# Patient Record
Sex: Female | Born: 1990
Health system: Southern US, Community
[De-identification: ages and names within clinical notes are randomized; demographics above are authoritative.]

## PROBLEM LIST (undated history)

## (undated) ENCOUNTER — Inpatient Hospital Stay (HOSPITAL_COMMUNITY): Payer: Self-pay

## (undated) DIAGNOSIS — Z87440 Personal history of urinary (tract) infections: Secondary | ICD-10-CM

## (undated) DIAGNOSIS — T7840XA Allergy, unspecified, initial encounter: Secondary | ICD-10-CM

## (undated) DIAGNOSIS — J069 Acute upper respiratory infection, unspecified: Secondary | ICD-10-CM

## (undated) DIAGNOSIS — R519 Headache, unspecified: Secondary | ICD-10-CM

## (undated) DIAGNOSIS — F419 Anxiety disorder, unspecified: Secondary | ICD-10-CM

## (undated) DIAGNOSIS — R51 Headache: Secondary | ICD-10-CM

## (undated) HISTORY — DX: Allergy, unspecified, initial encounter: T78.40XA

## (undated) HISTORY — DX: Personal history of urinary (tract) infections: Z87.440

## (undated) HISTORY — DX: Headache, unspecified: R51.9

## (undated) HISTORY — DX: Acute upper respiratory infection, unspecified: J06.9

## (undated) HISTORY — PX: NO PAST SURGERIES: SHX2092

## (undated) HISTORY — DX: Anxiety disorder, unspecified: F41.9

## (undated) HISTORY — DX: Headache: R51

---

## 2016-06-29 DIAGNOSIS — Z0000 Encounter for general adult medical examination without abnormal findings: Secondary | ICD-10-CM | POA: Diagnosis not present

## 2016-07-02 DIAGNOSIS — Z0000 Encounter for general adult medical examination without abnormal findings: Secondary | ICD-10-CM | POA: Diagnosis not present

## 2016-07-02 DIAGNOSIS — Z6830 Body mass index (BMI) 30.0-30.9, adult: Secondary | ICD-10-CM | POA: Diagnosis not present

## 2016-12-31 DIAGNOSIS — Z3490 Encounter for supervision of normal pregnancy, unspecified, unspecified trimester: Secondary | ICD-10-CM | POA: Diagnosis not present

## 2016-12-31 DIAGNOSIS — Z6830 Body mass index (BMI) 30.0-30.9, adult: Secondary | ICD-10-CM | POA: Diagnosis not present

## 2016-12-31 DIAGNOSIS — N910 Primary amenorrhea: Secondary | ICD-10-CM | POA: Diagnosis not present

## 2016-12-31 DIAGNOSIS — N912 Amenorrhea, unspecified: Secondary | ICD-10-CM | POA: Diagnosis not present

## 2017-01-23 DIAGNOSIS — N911 Secondary amenorrhea: Secondary | ICD-10-CM | POA: Diagnosis not present

## 2017-02-05 DIAGNOSIS — Z3401 Encounter for supervision of normal first pregnancy, first trimester: Secondary | ICD-10-CM | POA: Diagnosis not present

## 2017-02-05 LAB — OB RESULTS CONSOLE ABO/RH: RH TYPE: POSITIVE

## 2017-02-05 LAB — OB RESULTS CONSOLE RUBELLA ANTIBODY, IGM: RUBELLA: IMMUNE

## 2017-02-05 LAB — OB RESULTS CONSOLE HEPATITIS B SURFACE ANTIGEN: HEP B S AG: NEGATIVE

## 2017-02-05 LAB — OB RESULTS CONSOLE GC/CHLAMYDIA
Chlamydia: NEGATIVE
Gonorrhea: NEGATIVE

## 2017-02-05 LAB — OB RESULTS CONSOLE HIV ANTIBODY (ROUTINE TESTING): HIV: NONREACTIVE

## 2017-02-05 LAB — OB RESULTS CONSOLE ANTIBODY SCREEN: ANTIBODY SCREEN: NEGATIVE

## 2017-02-05 LAB — OB RESULTS CONSOLE RPR: RPR: NONREACTIVE

## 2017-02-11 DIAGNOSIS — Z3401 Encounter for supervision of normal first pregnancy, first trimester: Secondary | ICD-10-CM | POA: Diagnosis not present

## 2017-02-11 DIAGNOSIS — Z3480 Encounter for supervision of other normal pregnancy, unspecified trimester: Secondary | ICD-10-CM | POA: Diagnosis not present

## 2017-02-24 DIAGNOSIS — Z3683 Encounter for fetal screening for congenital cardiac abnormalities: Secondary | ICD-10-CM | POA: Diagnosis not present

## 2017-02-24 DIAGNOSIS — Z3491 Encounter for supervision of normal pregnancy, unspecified, first trimester: Secondary | ICD-10-CM | POA: Diagnosis not present

## 2017-02-24 DIAGNOSIS — Z36 Encounter for antenatal screening for chromosomal anomalies: Secondary | ICD-10-CM | POA: Diagnosis not present

## 2017-03-01 DIAGNOSIS — H5213 Myopia, bilateral: Secondary | ICD-10-CM | POA: Diagnosis not present

## 2017-04-09 DIAGNOSIS — Z363 Encounter for antenatal screening for malformations: Secondary | ICD-10-CM | POA: Diagnosis not present

## 2017-04-09 DIAGNOSIS — Z3400 Encounter for supervision of normal first pregnancy, unspecified trimester: Secondary | ICD-10-CM | POA: Diagnosis not present

## 2017-04-09 DIAGNOSIS — Z3A19 19 weeks gestation of pregnancy: Secondary | ICD-10-CM | POA: Diagnosis not present

## 2017-05-08 DIAGNOSIS — Z362 Encounter for other antenatal screening follow-up: Secondary | ICD-10-CM | POA: Diagnosis not present

## 2017-05-08 DIAGNOSIS — Z3400 Encounter for supervision of normal first pregnancy, unspecified trimester: Secondary | ICD-10-CM | POA: Diagnosis not present

## 2017-05-08 DIAGNOSIS — Z3A23 23 weeks gestation of pregnancy: Secondary | ICD-10-CM | POA: Diagnosis not present

## 2017-06-05 DIAGNOSIS — Z3400 Encounter for supervision of normal first pregnancy, unspecified trimester: Secondary | ICD-10-CM | POA: Diagnosis not present

## 2017-06-05 DIAGNOSIS — Z23 Encounter for immunization: Secondary | ICD-10-CM | POA: Diagnosis not present

## 2017-06-05 DIAGNOSIS — Z3480 Encounter for supervision of other normal pregnancy, unspecified trimester: Secondary | ICD-10-CM | POA: Diagnosis not present

## 2017-06-10 NOTE — L&D Delivery Note (Signed)
Operative Delivery Note At 7:39 PM a viable female was delivered via Vaginal, Vacuum Neurosurgeon).  Presentation: vertex; Position: Left,, Occiput,, Anterior; Station: +3. Of +3 She progressed well through IOL to c/c/+3 with excellent pushing effort. However, recurr, deep, decels noted with every ctxn and no accs/response to scalp stim. Discussed expedited delivery and patient consented to a VAVD. Pulled easily over one contraction.  Verbal consent: obtained from patient.  Risks and benefits discussed in detail.  Risks include, but are not limited to the risks of anesthesia, bleeding, infection, damage to maternal tissues, fetal cephalhematoma.  There is also the risk of inability to effect vaginal delivery of the head, or shoulder dystocia that cannot be resolved by established maneuvers, leading to the need for emergency cesarean section.  APGAR: pending, ; weight pending .   Placenta status: L&D Cord:  with the following complications: tight nuchal x1.  Cord pH: sent  Anesthesia:  CLE Instruments: Kiwi vacuum Episiotomy: None Lacerations:  2nd degree Suture Repair: 3.0 vicryl Est. Blood Loss (mL):  300cc No occult fourth on rectal exam  It's a girl - "[employer]cho[employer]"!! Mom to postpartum.  Baby to Couplet care / Skin to Skin.  Tyson Dense 09/04/2017, 8:06 PM

## 2017-06-17 DIAGNOSIS — O99810 Abnormal glucose complicating pregnancy: Secondary | ICD-10-CM | POA: Diagnosis not present

## 2017-07-15 ENCOUNTER — Other Ambulatory Visit: Payer: Self-pay | Admitting: *Deleted

## 2017-07-15 NOTE — Patient Outreach (Signed)
Received referral on 07/15/17 to contact this Chelsey Roberts regarding her positive answer on the Babyscripts survey related to family history of diabetes. Edwin Dada on her mobile number; she states her grandparent had diabetes but she does not have preexisting diabetes or gestational diabetes. She voices no complaints and states she is looking forward to the birth of her baby in early March. She did have a question about entering the code for completing the breastfeeding component of Babyscripts so provided her with answer after consulting with care management assistant [facility].  No needs identified so will close case to [facility] CM services.  Barrington Ellison RN,CCM,CDE [place] Management Coordinator Office Phone [phone] Office Fax [phone]

## 2017-08-05 DIAGNOSIS — Z3685 Encounter for antenatal screening for Streptococcus B: Secondary | ICD-10-CM | POA: Diagnosis not present

## 2017-08-21 ENCOUNTER — Telehealth (HOSPITAL_COMMUNITY): Payer: Self-pay | Admitting: *Deleted

## 2017-08-21 ENCOUNTER — Encounter (HOSPITAL_COMMUNITY): Payer: Self-pay | Admitting: *Deleted

## 2017-08-21 NOTE — Telephone Encounter (Signed)
Preadmission screen  

## 2017-09-03 NOTE — H&P (Signed)
Chelsey Roberts is a 26 y.o. female presenting for IOL. OB History    Gravida  1   Para      Term      Preterm      AB      Living        SAB      TAB      Ectopic      Multiple      Live Births             Past Medical History:  Diagnosis Date  . Headache   . History of acute cystitis    Family History: family history includes Diabetes in her paternal grandfather; Esophageal cancer in her maternal grandfather; Heart attack in her paternal grandfather; Hypertension in her paternal grandfather and paternal grandmother; Stroke in her paternal grandmother. Social History:  reports that she has never smoked. She has never used smokeless tobacco. She reports that she does not drink alcohol or use drugs.     Maternal Diabetes: No Genetic Screening: Normal Maternal Ultrasounds/Referrals: Normal Fetal Ultrasounds or other Referrals:  None Maternal Substance Abuse:  No Significant Maternal Medications:  None Significant Maternal Lab Results:  None Other Comments:  None  ROS History   Last menstrual period 11/26/2016. Exam Physical Exam  Prenatal labs: ABO, Rh: A/Positive/-- (08/29 0000) Antibody: Negative (08/29 0000) Rubella: Immune (08/29 0000) RPR: Nonreactive (08/29 0000)  HBsAg: Negative (08/29 0000)  HIV: Non-reactive (08/29 0000)  GBS:     Assessment/Plan: 27yo G1P0 @ 40.2 wga presenting for IOL. Cervix favorable - 3-4/80/-2. Plan pitocin/AROM. GBS neg. It's a girl - "Chelsey Roberts".     Tyson Dense 09/03/2017, 10:34 AM

## 2017-09-04 ENCOUNTER — Inpatient Hospital Stay (HOSPITAL_COMMUNITY)
Admission: RE | Admit: 2017-09-04 | Discharge: 2017-09-06 | DRG: 807 | Disposition: A | Discharge status: Home / Self Care | Payer: 59 | Source: Ambulatory Visit | Attending: Obstetrics and Gynecology | Admitting: Obstetrics and Gynecology

## 2017-09-04 ENCOUNTER — Inpatient Hospital Stay (HOSPITAL_COMMUNITY): Payer: 59 | Admitting: Anesthesiology

## 2017-09-04 ENCOUNTER — Encounter (HOSPITAL_COMMUNITY): Payer: Self-pay

## 2017-09-04 ENCOUNTER — Inpatient Hospital Stay (HOSPITAL_COMMUNITY): Admission: RE | Admit: 2017-09-04 | Payer: 59 | Source: Ambulatory Visit

## 2017-09-04 DIAGNOSIS — Z3A40 40 weeks gestation of pregnancy: Secondary | ICD-10-CM | POA: Diagnosis not present

## 2017-09-04 DIAGNOSIS — Z3483 Encounter for supervision of other normal pregnancy, third trimester: Secondary | ICD-10-CM | POA: Diagnosis present

## 2017-09-04 DIAGNOSIS — Z3490 Encounter for supervision of normal pregnancy, unspecified, unspecified trimester: Secondary | ICD-10-CM

## 2017-09-04 LAB — CBC
HCT: 41 % (ref 36.0–46.0)
Hemoglobin: 14 g/dL (ref 12.0–15.0)
MCH: 30.6 pg (ref 26.0–34.0)
MCHC: 34.1 g/dL (ref 30.0–36.0)
MCV: 89.7 fL (ref 78.0–100.0)
PLATELETS: 207 10*3/uL (ref 150–400)
RBC: 4.57 MIL/uL (ref 3.87–5.11)
RDW: 13 % (ref 11.5–15.5)
WBC: 15.3 10*3/uL — AB (ref 4.0–10.5)

## 2017-09-04 LAB — ABO/RH: ABO/RH(D): A POS

## 2017-09-04 LAB — TYPE AND SCREEN
ABO/RH(D): A POS
Antibody Screen: NEGATIVE

## 2017-09-04 MED ORDER — OXYCODONE-ACETAMINOPHEN 5-325 MG PO TABS: 1.0000 | ORAL_TABLET | ORAL | Status: DC | PRN

## 2017-09-04 MED ORDER — PHENYLEPHRINE 40 MCG/ML (10ML) SYRINGE FOR IV PUSH (FOR BLOOD PRESSURE SUPPORT)
80.0000 ug | PREFILLED_SYRINGE | INTRAVENOUS | Status: DC | PRN
  Filled 2017-09-04: qty 10

## 2017-09-04 MED ORDER — OXYTOCIN 40 UNITS IN LACTATED RINGERS INFUSION - SIMPLE MED: 2.5000 [IU]/h | INTRAVENOUS | Status: DC

## 2017-09-04 MED ORDER — BENZOCAINE-MENTHOL 20-0.5 % EX AERO
1.0000 "application " | INHALATION_SPRAY | CUTANEOUS | Status: DC | PRN
  Administered 2017-09-05: 1 via TOPICAL
  Filled 2017-09-04: qty 56

## 2017-09-04 MED ORDER — DIBUCAINE 1 % RE OINT: 1.0000 "application " | TOPICAL_OINTMENT | RECTAL | Status: DC | PRN

## 2017-09-04 MED ORDER — ONDANSETRON HCL 4 MG/2ML IJ SOLN: 4.0000 mg | INTRAMUSCULAR | Status: DC | PRN

## 2017-09-04 MED ORDER — ACETAMINOPHEN 325 MG PO TABS
650.0000 mg | ORAL_TABLET | ORAL | Status: DC | PRN
  Administered 2017-09-05 – 2017-09-06 (×4): 650 mg via ORAL
  Filled 2017-09-04 (×4): qty 2

## 2017-09-04 MED ORDER — EPHEDRINE 5 MG/ML INJ: 10.0000 mg | INTRAVENOUS | Status: DC | PRN

## 2017-09-04 MED ORDER — LACTATED RINGERS IV SOLN: 500.0000 mL | Freq: Once | INTRAVENOUS | Status: DC

## 2017-09-04 MED ORDER — OXYCODONE HCL 5 MG PO TABS: 10.0000 mg | ORAL_TABLET | ORAL | Status: DC | PRN

## 2017-09-04 MED ORDER — OXYTOCIN 40 UNITS IN LACTATED RINGERS INFUSION - SIMPLE MED
1.0000 m[IU]/min | INTRAVENOUS | Status: DC
  Administered 2017-09-04: 2 m[IU]/min via INTRAVENOUS
  Filled 2017-09-04: qty 1000

## 2017-09-04 MED ORDER — FLEET ENEMA 7-19 GM/118ML RE ENEM: 1.0000 | ENEMA | RECTAL | Status: DC | PRN

## 2017-09-04 MED ORDER — LACTATED RINGERS IV SOLN
INTRAVENOUS | Status: DC
  Administered 2017-09-04 (×2): via INTRAVENOUS

## 2017-09-04 MED ORDER — ONDANSETRON HCL 4 MG PO TABS: 4.0000 mg | ORAL_TABLET | ORAL | Status: DC | PRN

## 2017-09-04 MED ORDER — PHENYLEPHRINE 40 MCG/ML (10ML) SYRINGE FOR IV PUSH (FOR BLOOD PRESSURE SUPPORT): 80.0000 ug | PREFILLED_SYRINGE | INTRAVENOUS | Status: DC | PRN

## 2017-09-04 MED ORDER — DIPHENHYDRAMINE HCL 25 MG PO CAPS: 25.0000 mg | ORAL_CAPSULE | Freq: Four times a day (QID) | ORAL | Status: DC | PRN

## 2017-09-04 MED ORDER — TETANUS-DIPHTH-ACELL PERTUSSIS 5-2.5-18.5 LF-MCG/0.5 IM SUSP: 0.5000 mL | Freq: Once | INTRAMUSCULAR | Status: DC

## 2017-09-04 MED ORDER — OXYCODONE HCL 5 MG PO TABS: 5.0000 mg | ORAL_TABLET | ORAL | Status: DC | PRN

## 2017-09-04 MED ORDER — LIDOCAINE HCL (PF) 1 % IJ SOLN
INTRAMUSCULAR | Status: DC | PRN
  Administered 2017-09-04: 5 mL
  Administered 2017-09-04: 2 mL
  Administered 2017-09-04: 3 mL

## 2017-09-04 MED ORDER — SIMETHICONE 80 MG PO CHEW: 80.0000 mg | CHEWABLE_TABLET | ORAL | Status: DC | PRN

## 2017-09-04 MED ORDER — ZOLPIDEM TARTRATE 5 MG PO TABS: 5.0000 mg | ORAL_TABLET | Freq: Every evening | ORAL | Status: DC | PRN

## 2017-09-04 MED ORDER — WITCH HAZEL-GLYCERIN EX PADS: 1.0000 "application " | MEDICATED_PAD | CUTANEOUS | Status: DC | PRN

## 2017-09-04 MED ORDER — ACETAMINOPHEN 325 MG PO TABS: 650.0000 mg | ORAL_TABLET | ORAL | Status: DC | PRN

## 2017-09-04 MED ORDER — PRENATAL MULTIVITAMIN CH
1.0000 | ORAL_TABLET | Freq: Every day | ORAL | Status: DC
  Administered 2017-09-05 – 2017-09-06 (×2): 1 via ORAL
  Filled 2017-09-04 (×2): qty 1

## 2017-09-04 MED ORDER — LIDOCAINE HCL (PF) 1 % IJ SOLN
30.0000 mL | INTRAMUSCULAR | Status: DC | PRN
  Filled 2017-09-04: qty 30

## 2017-09-04 MED ORDER — SOD CITRATE-CITRIC ACID 500-334 MG/5ML PO SOLN: 30.0000 mL | ORAL | Status: DC | PRN

## 2017-09-04 MED ORDER — SENNOSIDES-DOCUSATE SODIUM 8.6-50 MG PO TABS
2.0000 | ORAL_TABLET | ORAL | Status: DC
  Administered 2017-09-05 (×2): 2 via ORAL
  Filled 2017-09-04 (×2): qty 2

## 2017-09-04 MED ORDER — OXYTOCIN BOLUS FROM INFUSION
500.0000 mL | Freq: Once | INTRAVENOUS | Status: AC
  Administered 2017-09-04: 500 mL via INTRAVENOUS

## 2017-09-04 MED ORDER — DIPHENHYDRAMINE HCL 50 MG/ML IJ SOLN: 12.5000 mg | INTRAMUSCULAR | Status: DC | PRN

## 2017-09-04 MED ORDER — LACTATED RINGERS IV SOLN
500.0000 mL | Freq: Once | INTRAVENOUS | Status: AC
  Administered 2017-09-04: 500 mL via INTRAVENOUS

## 2017-09-04 MED ORDER — TERBUTALINE SULFATE 1 MG/ML IJ SOLN
0.2500 mg | Freq: Once | INTRAMUSCULAR | Status: AC | PRN
  Administered 2017-09-04: 0.25 mg via SUBCUTANEOUS
  Filled 2017-09-04: qty 1

## 2017-09-04 MED ORDER — ONDANSETRON HCL 4 MG/2ML IJ SOLN: 4.0000 mg | Freq: Four times a day (QID) | INTRAMUSCULAR | Status: DC | PRN

## 2017-09-04 MED ORDER — OXYCODONE-ACETAMINOPHEN 5-325 MG PO TABS: 2.0000 | ORAL_TABLET | ORAL | Status: DC | PRN

## 2017-09-04 MED ORDER — LACTATED RINGERS IV SOLN: 500.0000 mL | INTRAVENOUS | Status: DC | PRN

## 2017-09-04 MED ORDER — COCONUT OIL OIL: 1.0000 "application " | TOPICAL_OIL | Status: DC | PRN

## 2017-09-04 MED ORDER — FENTANYL 2.5 MCG/ML BUPIVACAINE 1/10 % EPIDURAL INFUSION (WH - ANES)
14.0000 mL/h | INTRAMUSCULAR | Status: DC | PRN
  Administered 2017-09-04 (×2): 14 mL/h via EPIDURAL
  Filled 2017-09-04 (×2): qty 100

## 2017-09-04 MED ORDER — IBUPROFEN 600 MG PO TABS
600.0000 mg | ORAL_TABLET | Freq: Four times a day (QID) | ORAL | Status: DC
  Administered 2017-09-05 – 2017-09-06 (×7): 600 mg via ORAL
  Filled 2017-09-04 (×7): qty 1

## 2017-09-04 NOTE — Anesthesia Procedure Notes (Signed)
Epidural Patient location during procedure: OB Start time: 09/04/2017 9:15 AM End time: 09/04/2017 9:35 AM  Staffing Anesthesiologist: Suzette Battiest, MD Performed: anesthesiologist   Preanesthetic Checklist Completed: patient identified, site marked, surgical consent, pre-op evaluation, timeout performed, IV checked, risks and benefits discussed and monitors and equipment checked  Epidural Patient position: sitting Prep: site prepped and draped and DuraPrep Patient monitoring: continuous pulse ox and blood pressure Approach: right paramedian Location: L4-L5 Injection technique: LOR air  Needle:  Needle type: Tuohy  Needle gauge: 17 G Needle length: 9 cm and 9 Needle insertion depth: 7 cm Catheter type: closed end flexible Catheter size: 19 Gauge Catheter at skin depth: 14 (12-->14cm when laid in lat decub position) cm Test dose: negative  Assessment Events: blood not aspirated, injection not painful, no injection resistance, negative IV test and no paresthesia  Additional Notes Challenging epidural. Multiple interspaces attempted via midline approach without success. Right paramedian approach successful as above.

## 2017-09-04 NOTE — Anesthesia Preprocedure Evaluation (Signed)
Anesthesia Evaluation  Patient identified by MRN, date of birth, ID band Patient awake    Reviewed: Allergy & Precautions, Patient's Chart, lab work & pertinent test results  Airway Mallampati: II  TM Distance: >3 FB     Dental   Pulmonary neg pulmonary ROS,    Pulmonary exam normal        Cardiovascular negative cardio ROS Normal cardiovascular exam     Neuro/Psych negative neurological ROS     GI/Hepatic negative GI ROS, Neg liver ROS,   Endo/Other  negative endocrine ROS  Renal/GU negative Renal ROS     Musculoskeletal   Abdominal   Peds  Hematology negative hematology ROS (+)   Anesthesia Other Findings   Reproductive/Obstetrics (+) Pregnancy                             Lab Results  Component Value Date   WBC 15.3 (H) 09/04/2017   HGB 14.0 09/04/2017   HCT 41.0 09/04/2017   MCV 89.7 09/04/2017   PLT 207 09/04/2017    Anesthesia Physical Anesthesia Plan  ASA: II  Anesthesia Plan: Epidural   Post-op Pain Management:    Induction:   PONV Risk Score and Plan: Treatment may vary due to age or medical condition  Airway Management Planned: Natural Airway  Additional Equipment:   Intra-op Plan:   Post-operative Plan:   Informed Consent: I have reviewed the patients History and Physical, chart, labs and discussed the procedure including the risks, benefits and alternatives for the proposed anesthesia with the patient or authorized representative who has indicated his/her understanding and acceptance.     Plan Discussed with:   Anesthesia Plan Comments:         Anesthesia Quick Evaluation

## 2017-09-04 NOTE — Progress Notes (Signed)
CTBS for recurr decels. Terbutaline given. Pt repositioned. IVF bolus. Pitocin turned off. FHT improved. FHT now cat 1. SVE 6.5-7/100/0. IUPC placed. CTM closely. Pitocin restart when able.    Arty Baumgartner MD

## 2017-09-04 NOTE — Progress Notes (Signed)
C/o ctxns overnight before arrival SVE 3.5-4/80/-2, AROM for clear fluid Plan for pitocin GBS neg

## 2017-09-04 NOTE — Anesthesia Pain Management Evaluation Note (Signed)
  CRNA Pain Management Visit Note  Patient: Chelsey Roberts, 27 y.o., female  "Hello I am a member of the anesthesia team at [facility]. We have an anesthesia team available at all times to provide care throughout the hospital, including epidural management and anesthesia for C-section. I don't know your plan for the delivery whether it a natural birth, water birth, IV sedation, nitrous supplementation, doula or epidural, but we want to meet your pain goals."   1.Was your pain managed to your expectations on prior hospitalizations?   No prior hospitalizations  2.What is your expectation for pain management during this hospitalization?     Epidural  3.How can we help you reach that goal? epidural  Record the patient's initial score and the patient's pain goal.   Pain: 9  Pain Goal: 9 [facility] wants you to be able to say your pain was always managed very well.  [name] 09/04/2017

## 2017-09-05 ENCOUNTER — Other Ambulatory Visit: Payer: Self-pay

## 2017-09-05 LAB — CBC
HEMATOCRIT: 32.2 % — AB (ref 36.0–46.0)
HEMOGLOBIN: 11.1 g/dL — AB (ref 12.0–15.0)
MCH: 31 pg (ref 26.0–34.0)
MCHC: 34.5 g/dL (ref 30.0–36.0)
MCV: 89.9 fL (ref 78.0–100.0)
Platelets: 174 10*3/uL (ref 150–400)
RBC: 3.58 MIL/uL — AB (ref 3.87–5.11)
RDW: 13.2 % (ref 11.5–15.5)
WBC: 20.3 10*3/uL — ABNORMAL HIGH (ref 4.0–10.5)

## 2017-09-05 LAB — RPR: RPR: NONREACTIVE

## 2017-09-05 NOTE — Anesthesia Postprocedure Evaluation (Signed)
Anesthesia Post Note  Patient: Chelsey Roberts  Procedure(s) Performed: AN AD HOC LABOR EPIDURAL     Patient location during evaluation: Mother Baby Anesthesia Type: Epidural Level of consciousness: awake and alert Pain management: pain level controlled Vital Signs Assessment: post-procedure vital signs reviewed and stable Respiratory status: spontaneous breathing Cardiovascular status: blood pressure returned to baseline Postop Assessment: no headache, patient able to bend at knees, no backache, no apparent nausea or vomiting, epidural receding and adequate PO intake Anesthetic complications: no    Last Vitals:  Vitals:   09/05/17 0015 09/05/17 0530  BP: 138/70 127/65  Pulse: 85 (!) 56  Resp: 18 18  Temp:  36.9 C    Last Pain:  Vitals:   09/05/17 0530  TempSrc: Oral  PainSc: 4    Pain Goal:                 [name]

## 2017-09-05 NOTE — Progress Notes (Signed)
Post Partum Day 1 Subjective: no complaints  Objective: Blood pressure 127/65, pulse (!) 56, temperature 98.5 F (36.9 C), temperature source Oral, resp. rate 18, height 5\' 2"  (1.575 m), weight 175 lb 6.4 oz (79.6 kg), last menstrual period 11/26/2016, unknown if currently breastfeeding.  Physical Exam:  General: alert and cooperative Lochia: appropriate Uterine Fundus: firm Incision: healing well, no significant drainage DVT Evaluation: No evidence of DVT seen on physical exam.  Recent Labs    09/04/17 0746 09/05/17 0523  HGB 14.0 11.1*  HCT 41.0 32.2*    Assessment/Plan: Plan for discharge tomorrow and Breastfeeding   LOS: 1 day   Marylynn Pearson 09/05/2017, 8:21 AM

## 2017-09-05 NOTE — Lactation Note (Signed)
This note was copied from a baby's chart. Lactation Consultation Note  Patient Name: Chelsey Roberts [id number] Date: 09/05/2017 Reason for consult: Initial assessment;Primapara;1st time breastfeeding;Term  Visited with P42 Mom of term infant at 63 hrs old.  Baby has latched and breastfed 7 times already.  Mom concerned that baby has difficulty latching to right breast.  Offered to assist with positioning and latching.   Baby spitting moderate of mucous, with small streak of blood noted. Baby burping and gagging a little.  Baby not rooting to eat, positioned baby at the breast in football hold.  Demonstrated hand expression and breast massage, colostrum expressed.  Reassured Mom that baby needed to sleep on her chest STS.  Encouraged Mom to call at next feeding.  Mom has a area at 7pm around her nipple-areola on right breast.  This area resembles a flat nipple.  Mom has never been told it was an extra nipple, but she did have changes during later pregnancy, along with color changes of her areola.  Lactation brochure left with Mom.  Mom aware of IP and OP lactation services.  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  Tote Medela PIS DEBP given to Mom as part of her [employer].    Interventions: Breast feeding basics reviewed;Skin to skin;Hand express;Breast massage  Lactation Tools Discussed/Used WIC Program: No   Consult Status Consult Status: Follow-up Date: 09/06/17 Follow-up type: In-patient    [name] 09/05/2017, 3:09 PM

## 2017-09-06 LAB — BIRTH TISSUE RECOVERY COLLECTION (PLACENTA DONATION)

## 2017-09-06 MED ORDER — IBUPROFEN 600 MG PO TABS: 600.0000 mg | ORAL_TABLET | Freq: Four times a day (QID) | ORAL | 0 refills | Status: DC

## 2017-09-06 NOTE — Discharge Summary (Signed)
Obstetric Discharge Summary Reason for Admission: induction of labor Prenatal Procedures: none Intrapartum Procedures: spontaneous vaginal delivery Postpartum Procedures: none Complications-Operative and Postpartum: none Hemoglobin  Date Value Ref Range Status  09/05/2017 11.1 (L) 12.0 - 15.0 g/dL Final    Comment:    DELTA CHECK NOTED REPEATED TO VERIFY    HCT  Date Value Ref Range Status  09/05/2017 32.2 (L) 36.0 - 46.0 % Final    Physical Exam:  General: alert and cooperative Lochia: appropriate Uterine Fundus: firm Incision: no significant drainage DVT Evaluation: No evidence of DVT seen on physical exam.  Discharge Diagnoses: Term Pregnancy-delivered  Discharge Information: Date: 09/06/2017 Activity: pelvic rest Diet: routine Medications: PNV and Ibuprofen Condition: stable Instructions: refer to practice specific booklet Discharge to: home Follow-up [place] Schedule an appointment as soon as possible for a visit in 6 week(s).   Contact information: [place] [phone]           Newborn Data: Live born female  Birth Weight: 7 lb 3 oz (3260 g) APGAR: 80, 74  Newborn Delivery   Birth date/time:  09/04/2017 19:39:00 Delivery type:  Vaginal, Vacuum (Extractor)     Home with mother.  Marylynn Pearson 09/06/2017, 9:47 AM

## 2017-09-06 NOTE — Lactation Note (Addendum)
This note was copied from a baby's chart. Lactation Consultation Note  Patient Name: Girl Jiah Bari [id number] Date: 09/06/2017 Reason for consult: Follow-up assessment   P1, Baby 30 hours old and mother states breastfeeding is going well. Mother's abrasion on R nipple with bruise is healing.  She has coconut oil. Provided mother w/ manual pump. Baby cluster fed last night and mother states latch has improved. Discussed milk storage and pumping when going back to work. Mom encouraged to feed baby 8-12 times/24 hours and with feeding cues.  Reviewed engorgement care and monitoring voids/stools.    Maternal Data    Feeding    LATCH Score                   Interventions    Lactation Tools Discussed/Used     Consult Status Consult Status: Complete    Carlye Grippe 09/06/2017, 9:24 AM

## 2017-09-09 ENCOUNTER — Telehealth (HOSPITAL_COMMUNITY): Payer: Self-pay | Admitting: Lactation Services

## 2017-09-09 NOTE — Telephone Encounter (Signed)
P36, Baby 63 days old.  Mother called because her baby was born at 7 lb 3 ounces and weight had dropped at Peds visit to 6 lb 8 oz.  Mother had been breastfeeding for 30-40 min and MD told mother to limit feedings to 15 min per side and then offer formula afterwards.  Mother is now calling to state she is engorged and has a hard area in her R axillary area. Provided education regarding average breastfeeding time of 30-45 min. Mother did recently pump and received 4 ounces.  Explained to mother that she has a good milk supply and can supplement her baby with her own breastmilk.  Discussed apply warm compress to area for a few minutes before breastfeeding and pumping and massage area to release.  Unsure if it is plugged milk duct.  Mother does have an extra nipple below R areola that I am not sure mother is aware of. LC viewed inpatient.  Suggest mother come in for OP appointment to observe feeding to make sure baby is transferring.  Suggest mother supplement with her own breastmilk by pumping 4-5 times per day and giving volume back to baby after feedings.  MD recommended 1-1.5 ounces after feeding and with the volume pumped mother can easily supplement using her breastmilk. Encouraged mother to breastfeed longer than 15 min if baby desires and observed swallows and then switch to other breast.

## 2017-10-15 DIAGNOSIS — Z1389 Encounter for screening for other disorder: Secondary | ICD-10-CM | POA: Diagnosis not present

## 2018-03-22 ENCOUNTER — Encounter: Payer: Self-pay | Admitting: Nurse Practitioner

## 2018-03-22 ENCOUNTER — Ambulatory Visit (INDEPENDENT_AMBULATORY_CARE_PROVIDER_SITE_OTHER): Payer: Self-pay | Admitting: Nurse Practitioner

## 2018-03-22 VITALS — BP 122/90 | HR 92 | Temp 97.8°F | Resp 18 | Ht 62.0 in | Wt 156.8 lb

## 2018-03-22 DIAGNOSIS — J0140 Acute pansinusitis, unspecified: Secondary | ICD-10-CM

## 2018-03-22 MED ORDER — FLUTICASONE PROPIONATE 50 MCG/ACT NA SUSP: 2.0000 | Freq: Every day | NASAL | 0 refills | Status: DC

## 2018-03-22 MED ORDER — AMOXICILLIN-POT CLAVULANATE 875-125 MG PO TABS: 1.0000 | ORAL_TABLET | Freq: Two times a day (BID) | ORAL | 0 refills | Status: AC

## 2018-03-22 NOTE — Progress Notes (Signed)
Subjective:  Chelsey Roberts is a 27 y.o. female who presents for evaluation of possible sinusitis.  Symptoms include bilateral ear pressure/pain, fever: fevers up to 100.9 degrees, headache described as dull and aching, nasal congestion, post nasal drip, productive cough with yellowish-green colored sputum, sinus pressure, sinus pain and sore throat, tooth pain and facial pain.  Onset of symptoms was 3 days ago, and has been rapidly worsening since that time.  Patient informed that she has a 54-month-old at home, but is not breast-feeding.  Treatment to date:  Dayquil, Nyquil, Excedrin Migraine and netti pot.  High risk factors for influenza complications:  none.  The following portions of the patient's history were reviewed and updated as appropriate:  allergies, current medications and past medical history.  Constitutional: positive for anorexia, fatigue and fevers, negative for chills, night sweats, sweats and weight loss Eyes: negative Ears, nose, mouth, throat, and face: positive for hoarseness, nasal congestion, sore throat and See HPI, negative for ear drainage and earaches Respiratory: positive for cough and sputum, negative for asthma, chronic bronchitis, dyspnea on exertion, pneumonia, stridor and wheezing Cardiovascular: negative Gastrointestinal: positive for decreased appetite, negative for abdominal pain, diarrhea, nausea and vomiting Neurological: positive for headaches, negative for coordination problems, dizziness, paresthesia, speech problems, tremors, vertigo and weakness Allergic/Immunologic: positive for hay fever   Objective:  BP 122/90 (BP Location: Right Arm, Patient Position: Sitting, Cuff Size: Normal)   Pulse 92   Temp 97.8 F (36.6 C) (Oral)   Resp 18   Ht 5\' 2"  (1.575 m)   Wt 156 lb 12.8 oz (71.1 kg)   Breastfeeding? No   BMI 28.68 kg/m  General appearance: alert, cooperative, fatigued and no distress Head: Normocephalic, without obvious abnormality,  atraumatic Eyes: conjunctivae/corneas clear. PERRL, EOM's intact. Fundi benign. Ears: abnormal external canal right ear - mild erythema in canal, TM is opaque, able to visualize landmarks, no bulging, mucoid middle ear fluid and abnormal external canal left ear - mild erythema in canal, TM is opaque, able to visualize landmarks, no bulging, mucoid middle ear fluid Nose: clear discharge, turbinates swollen, inflamed, moderate maxillary sinus tenderness bilateral, moderate frontal sinus tenderness bilateral Throat: lips, mucosa, and tongue normal; teeth and gums normal Lungs: clear to auscultation bilaterally Heart: regular rate and rhythm, S1, S2 normal, no murmur, click, rub or gallop Abdomen: soft, non-tender; bowel sounds normal; no masses,  no organomegaly Pulses: 2+ and symmetric Skin: Skin color, texture, turgor normal. No rashes or lesions Lymph nodes: cervical and submandibular nodes normal Neurologic: Grossly normal    Assessment:  Acute Pansinusitis    Plan:   Exam findings, diagnosis etiology and medication use and indications reviewed with patient. Follow- Up and discharge instructions provided. No emergent/urgent issues found on exam. Patient education was provided. Patient verbalized understanding of information provided and agrees with plan of care (POC), all questions answered. The patient is advised to call or return to clinic if condition does not see an improvement in symptoms, or to seek the care of the closest emergency department if condition worsens with the above plan.   1. Acute pansinusitis, recurrence not specified  - amoxicillin-clavulanate (AUGMENTIN) 875-125 MG tablet; Take 1 tablet by mouth 2 (two) times daily for 10 days.  Dispense: 20 tablet; Refill: 0 - fluticasone (FLONASE) 50 MCG/ACT nasal spray; Place 2 sprays into both nostrils daily for 10 days.  Dispense: 16 g; Refill: 0 -Take medications as prescribed. -Ibuprofen or Tylenol for pain, fever, or  general discomfort. -Warm salt  water gargles for throat discomfort.  May  also use cough drops, or a teaspoon of honey as needed for throat discomfort. -Use humidifier or vaporizer when at home to help with congestion and cough. -Use saline nasal spray throughout the day to help with nasal congestion. -Follow-up if symptoms do not improve within the next 48 to 72 hours.

## 2018-03-22 NOTE — Patient Instructions (Signed)
Sinusitis, Adult -Take medications as prescribed. -Ibuprofen or Tylenol for pain, fever, or general discomfort. -Warm salt water gargles for throat discomfort.  May  also use cough drops, or a teaspoon of honey as needed for throat discomfort. -Use humidifier or vaporizer when at home to help with congestion and cough. -Use saline nasal spray throughout the day to help with nasal congestion. -Follow-up if symptoms do not improve within the next 48 to 72 hours.   Sinusitis is soreness and inflammation of your sinuses. Sinuses are hollow spaces in the bones around your face. Your sinuses are located:  Around your eyes.  In the middle of your forehead.  Behind your nose.  In your cheekbones.  Your sinuses and nasal passages are lined with a stringy fluid (mucus). Mucus normally drains out of your sinuses. When your nasal tissues become inflamed or swollen, the mucus can become trapped or blocked so air cannot flow through your sinuses. This allows bacteria, viruses, and funguses to grow, which leads to infection. Sinusitis can develop quickly and last for 7?10 days (acute) or for more than 12 weeks (chronic). Sinusitis often develops after a cold. What are the causes? This condition is caused by anything that creates swelling in the sinuses or stops mucus from draining, including:  Allergies.  Asthma.  Bacterial or viral infection.  Abnormally shaped bones between the nasal passages.  Nasal growths that contain mucus (nasal polyps).  Narrow sinus openings.  Pollutants, such as chemicals or irritants in the air.  A foreign object stuck in the nose.  A fungal infection. This is rare.  What increases the risk? The following factors may make you more likely to develop this condition:  Having allergies or asthma.  Having had a recent cold or respiratory tract infection.  Having structural deformities or blockages in your nose or sinuses.  Having a weak immune  system.  Doing a lot of swimming or diving.  Overusing nasal sprays.  Smoking.  What are the signs or symptoms? The main symptoms of this condition are pain and a feeling of pressure around the affected sinuses. Other symptoms include:  Upper toothache.  Earache.  Headache.  Bad breath.  Decreased sense of smell and taste.  A cough that may get worse at night.  Fatigue.  Fever.  Thick drainage from your nose. The drainage is often green and it may contain pus (purulent).  Stuffy nose or congestion.  Postnasal drip. This is when extra mucus collects in the throat or back of the nose.  Swelling and warmth over the affected sinuses.  Sore throat.  Sensitivity to light.  How is this diagnosed? This condition is diagnosed based on symptoms, a medical history, and a physical exam. To find out if your condition is acute or chronic, your health care provider may:  Look in your nose for signs of nasal polyps.  Tap over the affected sinus to check for signs of infection.  View the inside of your sinuses using an imaging device that has a light attached (endoscope).  If your health care provider suspects that you have chronic sinusitis, you may also:  Be tested for allergies.  Have a sample of mucus taken from your nose (nasal culture) and checked for bacteria.  Have a mucus sample examined to see if your sinusitis is related to an allergy.  If your sinusitis does not respond to treatment and it lasts longer than 8 weeks, you may have an MRI or CT scan to check your  sinuses. These scans also help to determine how severe your infection is. In rare cases, a bone biopsy may be done to rule out more serious types of fungal sinus disease. How is this treated? Treatment for sinusitis depends on the cause and whether your condition is chronic or acute. If a virus is causing your sinusitis, your symptoms will go away on their own within 10 days. You may be given medicines to  relieve your symptoms, including:  Topical nasal decongestants. They shrink swollen nasal passages and let mucus drain from your sinuses.  Antihistamines. These drugs block inflammation that is triggered by allergies. This can help to ease swelling in your nose and sinuses.  Topical nasal corticosteroids. These are nasal sprays that ease inflammation and swelling in your nose and sinuses.  Nasal saline washes. These rinses can help to get rid of thick mucus in your nose.  If your condition is caused by bacteria, you will be given an antibiotic medicine. If your condition is caused by a fungus, you will be given an antifungal medicine. Surgery may be needed to correct underlying conditions, such as narrow nasal passages. Surgery may also be needed to remove polyps. Follow these instructions at home: Medicines  Take, use, or apply over-the-counter and prescription medicines only as told by your health care provider. These may include nasal sprays.  If you were prescribed an antibiotic medicine, take it as told by your health care provider. Do not stop taking the antibiotic even if you start to feel better. Hydrate and Humidify  Drink enough water to keep your urine clear or pale yellow. Staying hydrated will help to thin your mucus.  Use a cool mist humidifier to keep the humidity level in your home above 50%.  Inhale steam for 10-15 minutes, 3-4 times a day or as told by your health care provider. You can do this in the bathroom while a hot shower is running.  Limit your exposure to cool or dry air. Rest  Rest as much as possible.  Sleep with your head raised (elevated).  Make sure to get enough sleep each night. General instructions  Apply a warm, moist washcloth to your face 3-4 times a day or as told by your health care provider. This will help with discomfort.  Wash your hands often with soap and water to reduce your exposure to viruses and other germs. If soap and water are  not available, use hand sanitizer.  Do not smoke. Avoid being around people who are smoking (secondhand smoke).  Keep all follow-up visits as told by your health care provider. This is important. Contact a health care provider if:  You have a fever.  Your symptoms get worse.  Your symptoms do not improve within 10 days. Get help right away if:  You have a severe headache.  You have persistent vomiting.  You have pain or swelling around your face or eyes.  You have vision problems.  You develop confusion.  Your neck is stiff.  You have trouble breathing. This information is not intended to replace advice given to you by your health care provider. Make sure you discuss any questions you have with your health care provider. Document Released: 05/27/2005 Document Revised: 01/21/2016 Document Reviewed: 03/22/2015 Elsevier Interactive Patient Education  [employer].

## 2018-03-24 ENCOUNTER — Telehealth: Payer: Self-pay

## 2018-03-24 NOTE — Telephone Encounter (Signed)
Patient states she is doing better, besides her left side of her face is still hurting and the left ear is bothering her, she is waiting to be on the antibiotics for 72 hrs to see if she is going to improve.

## 2018-03-25 ENCOUNTER — Ambulatory Visit (INDEPENDENT_AMBULATORY_CARE_PROVIDER_SITE_OTHER): Payer: Self-pay | Admitting: Family Medicine

## 2018-03-25 ENCOUNTER — Encounter: Payer: Self-pay | Admitting: Family Medicine

## 2018-03-25 VITALS — BP 110/80 | HR 87 | Temp 98.6°F | Wt 158.2 lb

## 2018-03-25 DIAGNOSIS — R059 Cough, unspecified: Secondary | ICD-10-CM

## 2018-03-25 DIAGNOSIS — R05 Cough: Secondary | ICD-10-CM

## 2018-03-25 DIAGNOSIS — J329 Chronic sinusitis, unspecified: Secondary | ICD-10-CM

## 2018-03-25 MED ORDER — PSEUDOEPH-BROMPHEN-DM 30-2-10 MG/5ML PO SYRP: 5.0000 mL | ORAL_SOLUTION | Freq: Three times a day (TID) | ORAL | 0 refills | Status: DC | PRN

## 2018-03-25 NOTE — Patient Instructions (Signed)

## 2018-03-25 NOTE — Progress Notes (Signed)
Chelsey Roberts is a 27 y.o. female who presents today with concerns of ear pain and congestion. The symptoms originally began 6 days ago. 3 days ago patient was seen and treated in clinic and treated with flonase and antibiotic. She has take approximately 3 days or 6 doses. She reports that she in feeling better but still symptomatic with tooth and facial pain mainly on her right side. She has attempted multiple over the counter medication prior to starting the antibiotic course with only minimal relief. She does confirm that she is using the medication as prescribed. She is not lactating and has a 61 month old infant at home.  Review of Systems  Constitutional: Negative for chills, fever and malaise/fatigue.  HENT: Positive for congestion and sinus pain. Negative for ear discharge, ear pain and sore throat.   Eyes: Negative.   Respiratory: Negative for cough, sputum production and shortness of breath.   Cardiovascular: Negative.  Negative for chest pain.  Gastrointestinal: Negative for abdominal pain, diarrhea, nausea and vomiting.  Genitourinary: Negative for dysuria, frequency, hematuria and urgency.  Musculoskeletal: Negative for myalgias.  Skin: Negative.   Neurological: Negative for headaches.  Endo/Heme/Allergies: Negative.   Psychiatric/Behavioral: Negative.     O: Vitals:   03/25/18 0850  BP: 110/80  Pulse: 87  Temp: 98.6 F (37 C)  SpO2: 98%     Physical Exam  Constitutional: She is oriented to person, place, and time. Vital signs are normal. She appears well-developed and well-nourished. She is active.  Non-toxic appearance. She does not have a sickly appearance.  HENT:  Head: Normocephalic.  Right Ear: Hearing, external ear and ear canal normal. A middle ear effusion is present.  Left Ear: Hearing, external ear and ear canal normal. A middle ear effusion is present.  Nose: Rhinorrhea present. Right sinus exhibits maxillary sinus tenderness and frontal sinus tenderness.  Left sinus exhibits maxillary sinus tenderness and frontal sinus tenderness.  Mouth/Throat: Uvula is midline and oropharynx is clear and moist.  Neck: Normal range of motion. Neck supple.  Cardiovascular: Normal rate, regular rhythm, normal heart sounds and normal pulses.  Pulmonary/Chest: Effort normal and breath sounds normal.  Abdominal: Soft. Bowel sounds are normal.  Musculoskeletal: Normal range of motion.  Lymphadenopathy:       Head (right side): Tonsillar adenopathy present. No submental and no submandibular adenopathy present.       Head (left side): Tonsillar adenopathy present. No submental and no submandibular adenopathy present.    She has cervical adenopathy.       Right cervical: Superficial cervical adenopathy present.       Left cervical: Superficial cervical adenopathy present.  Neurological: She is alert and oriented to person, place, and time.  Psychiatric: She has a normal mood and affect.  Vitals reviewed.  A: 1. Sinusitis, unspecified chronicity, unspecified location   2. Cough    P: Discussed exam findings, diagnosis etiology and medication use and indications reviewed with patient. Follow- Up and discharge instructions provided. No emergent/urgent issues found on exam.  Patient verbalized understanding of information provided and agrees with plan of care (POC), all questions answered.  Discussed symptom management for 48 hours and if not improved at that time will discuss possibility of treatment failure on prescribed antibiotic and using a different class to treat. Considering Doxy 100 mg BID at this time if not improved. Discussed with patient who agrees 48 hour work not provided and phone number to contact staff direct if concerned.  1. Sinusitis, unspecified  chronicity, unspecified location - brompheniramine-pseudoephedrine-DM 30-2-10 MG/5ML syrup; Take 5-10 mLs by mouth 3 (three) times daily as needed.  2. Cough - brompheniramine-pseudoephedrine-DM  30-2-10 MG/5ML syrup; Take 5-10 mLs by mouth 3 (three) times daily as needed.

## 2018-04-01 DIAGNOSIS — Z6829 Body mass index (BMI) 29.0-29.9, adult: Secondary | ICD-10-CM | POA: Diagnosis not present

## 2018-04-01 DIAGNOSIS — Z01419 Encounter for gynecological examination (general) (routine) without abnormal findings: Secondary | ICD-10-CM | POA: Diagnosis not present

## 2018-04-02 ENCOUNTER — Other Ambulatory Visit: Payer: Self-pay | Admitting: Obstetrics and Gynecology

## 2018-04-02 DIAGNOSIS — L539 Erythematous condition, unspecified: Secondary | ICD-10-CM

## 2018-04-14 ENCOUNTER — Ambulatory Visit
Admission: RE | Admit: 2018-04-14 | Discharge: 2018-04-14 | Disposition: A | Discharge status: Home / Self Care | Payer: 59 | Source: Ambulatory Visit | Attending: Obstetrics and Gynecology | Admitting: Obstetrics and Gynecology

## 2018-04-14 DIAGNOSIS — N6489 Other specified disorders of breast: Secondary | ICD-10-CM | POA: Diagnosis not present

## 2018-04-14 DIAGNOSIS — L539 Erythematous condition, unspecified: Secondary | ICD-10-CM

## 2018-04-16 ENCOUNTER — Other Ambulatory Visit: Payer: Self-pay | Admitting: Nurse Practitioner

## 2018-04-16 DIAGNOSIS — J0140 Acute pansinusitis, unspecified: Secondary | ICD-10-CM

## 2018-05-06 DIAGNOSIS — N6459 Other signs and symptoms in breast: Secondary | ICD-10-CM | POA: Diagnosis not present

## 2018-06-16 ENCOUNTER — Other Ambulatory Visit (INDEPENDENT_AMBULATORY_CARE_PROVIDER_SITE_OTHER): Payer: Self-pay | Admitting: Family Medicine

## 2018-06-16 MED ORDER — METHYLPREDNISOLONE 4 MG PO TBPK: ORAL_TABLET | ORAL | 0 refills | Status: DC

## 2018-06-16 MED ORDER — AZITHROMYCIN 250 MG PO TABS: ORAL_TABLET | ORAL | 0 refills | Status: DC

## 2018-06-16 NOTE — Progress Notes (Signed)
She has had cough and congestion for the past 5 days.  Flu test was negative.  We will treat as bronchitis with Zithromax and a Medrol Dosepak.  Chest x-ray if symptoms persist.

## 2018-06-17 DIAGNOSIS — D485 Neoplasm of uncertain behavior of skin: Secondary | ICD-10-CM | POA: Diagnosis not present

## 2018-08-01 DIAGNOSIS — H5213 Myopia, bilateral: Secondary | ICD-10-CM | POA: Diagnosis not present

## 2018-08-06 ENCOUNTER — Ambulatory Visit (INDEPENDENT_AMBULATORY_CARE_PROVIDER_SITE_OTHER): Payer: Self-pay | Admitting: Physician Assistant

## 2018-08-06 VITALS — BP 120/78 | HR 80 | Temp 97.8°F | Resp 18 | Wt 170.4 lb

## 2018-08-06 DIAGNOSIS — B9789 Other viral agents as the cause of diseases classified elsewhere: Secondary | ICD-10-CM

## 2018-08-06 DIAGNOSIS — J329 Chronic sinusitis, unspecified: Secondary | ICD-10-CM

## 2018-08-06 MED ORDER — PROMETHAZINE-DM 6.25-15 MG/5ML PO SYRP: 5.0000 mL | ORAL_SOLUTION | Freq: Every evening | ORAL | 0 refills | Status: DC | PRN

## 2018-08-06 MED ORDER — BENZONATATE 100 MG PO CAPS: 100.0000 mg | ORAL_CAPSULE | Freq: Three times a day (TID) | ORAL | 0 refills | Status: DC | PRN

## 2018-08-06 MED ORDER — FLUTICASONE PROPIONATE 50 MCG/ACT NA SUSP: 2.0000 | Freq: Every day | NASAL | 0 refills | Status: DC

## 2018-08-06 MED ORDER — SALINE SPRAY 0.65 % NA SOLN: 1.0000 | NASAL | 0 refills | Status: DC | PRN

## 2018-08-06 MED ORDER — GUAIFENESIN ER 1200 MG PO TB12: 1.0000 | ORAL_TABLET | Freq: Two times a day (BID) | ORAL | 0 refills | Status: DC | PRN

## 2018-08-06 MED ORDER — PSEUDOEPH-BROMPHEN-DM 30-2-10 MG/5ML PO SYRP: 5.0000 mL | ORAL_SOLUTION | Freq: Three times a day (TID) | ORAL | 0 refills | Status: DC | PRN

## 2018-08-06 MED FILL — PROMETHAZINE W/DM SYRUP: 6.25-15 | 23 days supply | Qty: 118 | Fill #0

## 2018-08-06 MED FILL — FLUTICASONE PROP 50 MCG SPR: 50 | 30 days supply | Qty: 16 | Fill #0

## 2018-08-06 MED FILL — BROMPHENIR-PSEUDOEPHED-DM S: 30-2-10 | 7 days supply | Qty: 120 | Fill #0

## 2018-08-06 MED FILL — BENZONATATE 100 MG CAPS: 100 | 7 days supply | Qty: 40 | Fill #0

## 2018-08-06 NOTE — Progress Notes (Signed)
MRN: [id number] DOB: [birth year]  Subjective:   Chelsey Roberts is a 28 y.o. female presenting for chief complaint of Sinus Problem (x 3 days); Nasal Congestion (x 3 days); and Ear Problem (started tpday, left ear ) .  Reports 3 day history of sinus congestion, nasal congestion, and ear fullness. Feels like she needs to sneeze. Having a dry cough. Denies fever, sore throat, inability to swallow, voice change, productive cough, wheezing, shortness of breath, chest tightness, chest pain and myalgia, nausea, vomiting, abdominal pain and diarrhea. Not getting much sleep due to congestion and she has an 37 month old at home. Just started sudafed, has been trying flonase and zyrtec daily with no full relief. No known sick contact exposure. Has history of seasonal allergies-worse in Fall and Spring. Denies history of asthma. Patient has had flu shot this season. Denies smoking. Last treated for sinusitis in 03/2018 w/ augmentin. LMP 3 weeks ago. Not currently breastfeeding.  Denies any other aggravating or relieving factors, no other questions or concerns.  Review of Systems  Constitutional: Negative for diaphoresis.  Eyes: Negative for blurred vision and double vision.  Musculoskeletal: Negative for neck pain.  Skin: Negative for rash.  Neurological: Negative for dizziness and headaches.    Chelsey Roberts has a current medication list which includes the following prescription(s): acetaminophen, diphenhydramine, estarylla, prenatal multivitamin, pseudoephedrine, azithromycin, brompheniramine-pseudoephedrine-dm, fluticasone, ibuprofen, and methylprednisolone. Also is allergic to tamiflu [oseltamivir phosphate].  Chelsey Roberts  has a past medical history of Headache and History of acute cystitis. Also  has no past surgical history on file.   Objective:   Vitals: BP 120/78 (BP Location: Right Arm, Patient Position: Sitting, Cuff Size: Normal)   Pulse 80   Temp 97.8 F (36.6 C) (Oral)   Resp 18   Wt 170 lb 6.4 oz  (77.3 kg)   SpO2 99%   BMI 31.17 kg/m   Physical Exam Vitals signs reviewed.  Constitutional:      General: She is not in acute distress.    Appearance: She is well-developed. She is not ill-appearing or toxic-appearing.  HENT:     Head: Normocephalic and atraumatic.     Right Ear: Ear canal and external ear normal. A middle ear effusion is present. No mastoid tenderness. Tympanic membrane is not erythematous or bulging.     Left Ear: Ear canal and external ear normal. A middle ear effusion is present. No mastoid tenderness. Tympanic membrane is not erythematous or bulging.     Nose: Mucosal edema, congestion and rhinorrhea present. Rhinorrhea is clear.     Right Sinus: Maxillary sinus tenderness (mild) present. No frontal sinus tenderness.     Left Sinus: Maxillary sinus tenderness (mild) present. No frontal sinus tenderness.     Mouth/Throat:     Lips: Pink.     Mouth: Mucous membranes are moist.     Pharynx: Uvula midline. Posterior oropharyngeal erythema present.     Tonsils: No tonsillar exudate or tonsillar abscesses.  Eyes:     Extraocular Movements: Extraocular movements intact.     Conjunctiva/sclera: Conjunctivae normal.     Pupils: Pupils are equal, round, and reactive to light.  Neck:     Musculoskeletal: Normal range of motion.  Cardiovascular:     Rate and Rhythm: Normal rate and regular rhythm.     Heart sounds: Normal heart sounds.  Pulmonary:     Effort: Pulmonary effort is normal.     Breath sounds: Normal breath sounds. No decreased breath sounds, wheezing, rhonchi or rales.  Lymphadenopathy:     Head:     Right side of head: No submental, submandibular, tonsillar, preauricular, posterior auricular or occipital adenopathy.     Left side of head: No submental, submandibular, tonsillar, preauricular, posterior auricular or occipital adenopathy.     Cervical: No cervical adenopathy.     Upper Body:     Right upper body: No supraclavicular adenopathy.     Left  upper body: No supraclavicular adenopathy.  Skin:    General: Skin is warm and dry.  Neurological:     Mental Status: She is alert.     No results found for this or any previous visit (from the past 24 hour(s)).  Assessment and Plan :  1. Viral sinusitis Pt is overall well appearing, NAD. VSS. Hx and PE findings consistent with viral sinusitis vs viral URI. Likely aggravate by underlying seasonal allergies due to frequent extreme changes in weather recently. No concern for underlying bacterial etiology at this time.  Recommend symptomatic treatment at this time.  Advised to contact office if sinus pressure persists/worsens over the next 5-7 days, would consider Rx for antibiotic at that time.  Advised to follow-up in office, with family doctor, or local urgent care if no improvement in symptoms after 7 to 10 days.  Seek care sooner at local urgent care or ED if symptoms worsen/develop new concerning symptoms.  Patient voices understanding. - promethazine-dextromethorphan (PROMETHAZINE-DM) 6.25-15 MG/5ML syrup; Take 5 mLs by mouth at bedtime as needed for cough.  Dispense: 118 mL; Refill: 0 - fluticasone (FLONASE) 50 MCG/ACT nasal spray; Place 2 sprays into both nostrils daily.  Dispense: 16 g; Refill: 0 - sodium chloride (OCEAN) 0.65 % SOLN nasal spray; Place 1 spray into both nostrils as needed.  Dispense: 60 mL; Refill: 0 - Guaifenesin (MUCINEX MAXIMUM STRENGTH) 1200 MG TB12; Take 1 tablet (1,200 mg total) by mouth every 12 (twelve) hours as needed.  Dispense: 14 tablet; Refill: 0 - benzonatate (TESSALON) 100 MG capsule; Take 1-2 capsules (100-200 mg total) by mouth 3 (three) times daily as needed for cough.  Dispense: 40 capsule; Refill: [facility] 08/06/2018 10:01 AM

## 2018-08-06 NOTE — Patient Instructions (Signed)
Sinusitis, Adult   Start bromfed, flonase, and mucinex during the day.  Use nasal saline rinse and promethazine dm at night time.  Use tessalon perles if you need to stop the cough.  Continue resting, oral hydration, and light meals.  Use warm mist humidifer in your room.  If no improvement in 5-7 days, contact our office.  If anything worsens, seek care sooner.   Sinusitis is soreness and swelling (inflammation) of your sinuses. Sinuses are hollow spaces in the bones around your face. They are located:  Around your eyes.  In the middle of your forehead.  Behind your nose.  In your cheekbones. Your sinuses and nasal passages are lined with a fluid called mucus. Mucus drains out of your sinuses. Swelling can trap mucus in your sinuses. This lets germs (bacteria, virus, or fungus) grow, which leads to infection. Most of the time, this condition is caused by a virus. What are the causes? This condition is caused by:  Allergies.  Asthma.  Germs.  Things that block your nose or sinuses.  Growths in the nose (nasal polyps).  Chemicals or irritants in the air.  Fungus (rare). What increases the risk? You are more likely to develop this condition if:  You have a weak body defense system (immune system).  You do a lot of swimming or diving.  You use nasal sprays too much.  You smoke. What are the signs or symptoms? The main symptoms of this condition are pain and a feeling of pressure around the sinuses. Other symptoms include:  Stuffy nose (congestion).  Runny nose (drainage).  Swelling and warmth in the sinuses.  Headache.  Toothache.  A cough that may get worse at night.  Mucus that collects in the throat or the back of the nose (postnasal drip).  Being unable to smell and taste.  Being very tired (fatigue).  A fever.  Sore throat.  Bad breath. How is this diagnosed? This condition is diagnosed based on:  Your symptoms.  Your medical  history.  A physical exam.  Tests to find out if your condition is short-term (acute) or long-term (chronic). Your doctor may: ? Check your nose for growths (polyps). ? Check your sinuses using a tool that has a light (endoscope). ? Check for allergies or germs. ? Do imaging tests, such as an MRI or CT scan. How is this treated? Treatment for this condition depends on the cause and whether it is short-term or long-term.  If caused by a virus, your symptoms should go away on their own within 10 days. You may be given medicines to relieve symptoms. They include: ? Medicines that shrink swollen tissue in the nose. ? Medicines that treat allergies (antihistamines). ? A spray that treats swelling of the nostrils. ? Rinses that help get rid of thick mucus in your nose (nasal saline washes).  If caused by bacteria, your doctor may wait to see if you will get better without treatment. You may be given antibiotic medicine if you have: ? A very bad infection. ? A weak body defense system.  If caused by growths in the nose, you may need to have surgery. Follow these instructions at home: Medicines  Take, use, or apply over-the-counter and prescription medicines only as told by your doctor. These may include nasal sprays.  If you were prescribed an antibiotic medicine, take it as told by your doctor. Do not stop taking the antibiotic even if you start to feel better. Hydrate and humidify  Drink enough water to keep your pee (urine) pale yellow.  Use a cool mist humidifier to keep the humidity level in your home above 50%.  Breathe in steam for 10-15 minutes, 3-4 times a day, or as told by your doctor. You can do this in the bathroom while a hot shower is running.  Try not to spend time in cool or dry air. Rest  Rest as much as you can.  Sleep with your head raised (elevated).  Make sure you get enough sleep each night. General instructions   Put a warm, moist washcloth on your  face 3-4 times a day, or as often as told by your doctor. This will help with discomfort.  Wash your hands often with soap and water. If there is no soap and water, use hand sanitizer.  Do not smoke. Avoid being around people who are smoking (secondhand smoke).  Keep all follow-up visits as told by your doctor. This is important. Contact a doctor if:  You have a fever.  Your symptoms get worse.  Your symptoms do not get better within 10 days. Get help right away if:  You have a very bad headache.  You cannot stop throwing up (vomiting).  You have very bad pain or swelling around your face or eyes.  You have trouble seeing.  You feel confused.  Your neck is stiff.  You have trouble breathing. Summary  Sinusitis is swelling of your sinuses. Sinuses are hollow spaces in the bones around your face.  This condition is caused by tissues in your nose that become inflamed or swollen. This traps germs. These can lead to infection.  If you were prescribed an antibiotic medicine, take it as told by your doctor. Do not stop taking it even if you start to feel better.  Keep all follow-up visits as told by your doctor. This is important. This information is not intended to replace advice given to you by your health care provider. Make sure you discuss any questions you have with your health care provider. Document Released: 11/13/2007 Document Revised: 10/27/2017 Document Reviewed: 10/27/2017 Elsevier Interactive Patient Education  2019 [employer].

## 2018-09-03 ENCOUNTER — Ambulatory Visit: Payer: 59 | Admitting: Allergy

## 2018-09-03 ENCOUNTER — Other Ambulatory Visit: Payer: Self-pay

## 2018-09-03 ENCOUNTER — Encounter: Payer: Self-pay | Admitting: Allergy

## 2018-09-03 VITALS — BP 118/76 | HR 92 | Temp 98.6°F | Resp 16 | Ht 61.42 in | Wt 168.8 lb

## 2018-09-03 DIAGNOSIS — H1013 Acute atopic conjunctivitis, bilateral: Secondary | ICD-10-CM

## 2018-09-03 DIAGNOSIS — J329 Chronic sinusitis, unspecified: Secondary | ICD-10-CM | POA: Diagnosis not present

## 2018-09-03 DIAGNOSIS — J3089 Other allergic rhinitis: Secondary | ICD-10-CM

## 2018-09-03 MED ORDER — AZELASTINE HCL 0.1 % NA SOLN: 2.0000 | Freq: Two times a day (BID) | NASAL | 5 refills | Status: DC

## 2018-09-03 MED ORDER — OLOPATADINE HCL 0.2 % OP SOLN: 1.0000 [drp] | Freq: Every day | OPHTHALMIC | 3 refills | Status: DC | PRN

## 2018-09-03 MED ORDER — OLOPATADINE HCL 0.2 % OP SOLN: 1.0000 [drp] | Freq: Every day | OPHTHALMIC | 5 refills | Status: DC

## 2018-09-03 MED ORDER — FLUTICASONE PROPIONATE 50 MCG/ACT NA SUSP: 2.0000 | Freq: Every day | NASAL | 5 refills | Status: DC

## 2018-09-03 NOTE — Patient Instructions (Addendum)
Allergies   - environmental allergy skin testing today is positive to tree pollens, weed pollens, grass pollens, molds  - allergen avoidance measures discussed/handouts provided  - continue Zyrtec 10mg  daily.  Can also try Xyzal 5mg  or Allegra 180mg  if Zyrtec becomes less effective.    - for itchy/watery/red eyes Pataday 1 drop each eye daily as needed  - for nasal congestion continue use of Flonase 2 sprays each nostril daily as needed.  Use for 1-2 weeks at a time before stopping once symptoms.    - for nasal drainage/post-nasal drip use nasal antihistamine, Astelin 2 sprays each nostril twice a day as needed.    - allergen immunotherapy discussed today including protocol, benefits and risk.  Informational handout provided.  If interested in this therapuetic option you can check with your insurance carrier for coverage.  Let us know if you would like to proceed with this option.    Recurrent sinus infections  - if you continue to have recurrent infections would recommend an immune system screen however at this time believe your symptoms are related to allergies as above  Follow-up 6 months or sooner if needed

## 2018-09-03 NOTE — Progress Notes (Signed)
New Patient Note  RE: Chelsey Roberts MRN: [id number] DOB: September 07, 1990 Date of Office Visit: 09/03/2018  Referring provider: No ref. provider found Primary care provider: Patient, No Pcp Per  Chief Complaint: allergies  History of present illness: Chelsey Roberts is a 28 y.o. female presenting today for evaluation of seasonal allergies, recurrent sinus infections.    She states she was recommended to see an allergist at the [facility] due to her history of sinus infections and allergy symptoms.    End of 2018 into 2019 she was pregnant and delivered in March 2019.  She states during her pregnancy her allergies were very controlled and she did not have any issues.  However since delivery she has been having worsening allergy symptoms.  She also states prior to her pregnancy her allergies levels were also "terrible". She does a lot of outdoor activities including horse riding, camping, rodeo activities and states that she will have to leave the activity because she becomes symptomatic with her allergies.  She reports symptoms of watery/itchy eyes, runny nose, throat clearing, sneezing mostly during spring and fall.  Zyrtec daily during spring and fall.  She had taken claritin for years prior to switching to zyrtec.  She will use flonase as needed which helps somewhat.  She will use her contacts solution to help with her eye symptoms.    She states she has required 3-4 [facility] visits in the past year for sinus infections for ear pain, HA, sinus congestion.  She has been treated with antibiotics and one course of prednisone.  Usually treated with 1 round antibiotics.    She denies a history of asthma, eczema or food allergy.  Review of systems: Review of Systems  Constitutional: Negative for chills, fever and malaise/fatigue.  HENT: Positive for congestion and ear pain. Negative for ear discharge, nosebleeds, sinus pain and sore throat.   Eyes: Negative for pain, discharge and  redness.  Respiratory: Positive for cough. Negative for shortness of breath and wheezing.   Cardiovascular: Negative for chest pain.  Gastrointestinal: Negative for abdominal pain, constipation, diarrhea, heartburn, nausea and vomiting.  Musculoskeletal: Negative for joint pain.  Skin: Negative for itching and rash.  Neurological: Negative for headaches.    All other systems negative unless noted above in HPI  Past medical history: Past Medical History:  Diagnosis Date  . Headache   . History of acute cystitis   . Recurrent upper respiratory infection (URI)     Past surgical history: Past Surgical History:  Procedure Laterality Date  . NO PAST SURGERIES      Family history:  Family History  Problem Relation Age of Onset  . Esophageal cancer Maternal Grandfather   . Hypertension Paternal Grandmother   . Stroke Paternal Grandmother   . Hypertension Paternal Grandfather   . Diabetes Paternal Grandfather   . Heart attack Paternal Grandfather   . Allergic rhinitis Mother   . Heart murmur Mother   . Hypertension Father   . Angioedema Neg Hx   . Asthma Neg Hx   . Atopy Neg Hx   . Eczema Neg Hx   . Immunodeficiency Neg Hx   . Urticaria Neg Hx     Social history: She lives in a home without carpeting with electric and wood heating and central cooling.  There are dogs in the home there are horses outside the home.  There is no concern for water damage, mildew or roaches in the home.  She is a Armed forces technical officer.  She denies a smoking history.  Medication List: Allergies as of 09/03/2018      Reactions   Tamiflu [oseltamivir Phosphate] Nausea And Vomiting      Medication List       Accurate as of September 03, 2018 12:06 PM. Always use your most recent med list.        acetaminophen 500 MG tablet Commonly known as:  TYLENOL Take 1,000 mg by mouth every 6 (six) hours as needed for headache.   azelastine 0.1 % nasal spray Commonly known as:  ASTELIN Place 2 sprays  into both nostrils 2 (two) times daily.   diphenhydrAMINE 50 MG capsule Commonly known as:  BENADRYL Take 50 mg by mouth every 6 (six) hours as needed.   Estarylla 0.25-35 MG-MCG tablet Generic drug:  norgestimate-ethinyl estradiol Take 1 tablet by mouth as directed.   fluticasone 50 MCG/ACT nasal spray Commonly known as:  FLONASE Place 2 sprays into both nostrils daily.   fluticasone 50 MCG/ACT nasal spray Commonly known as:  FLONASE Place 2 sprays into both nostrils daily.   ibuprofen 600 MG tablet Commonly known as:  ADVIL,MOTRIN Take 1 tablet (600 mg total) by mouth every 6 (six) hours.   Olopatadine HCl 0.2 % Soln Apply 1 drop to eye daily as needed.   Olopatadine HCl 0.2 % Soln Apply 1 drop to eye daily.   prenatal multivitamin Tabs tablet Take 1 tablet by mouth daily at 12 noon.   sodium chloride 0.65 % Soln nasal spray Commonly known as:  OCEAN Place 1 spray into both nostrils as needed.       Known medication allergies: Allergies  Allergen Reactions  . Tamiflu [Oseltamivir Phosphate] Nausea And Vomiting     Physical examination: Blood pressure 118/76, pulse 92, temperature 98.6 F (37 C), temperature source Oral, resp. rate 16, height 5' 1.42" (1.56 m), weight 168 lb 12.8 oz (76.6 kg), last menstrual period 08/20/2018, SpO2 96 %, unknown if currently breastfeeding.  General: Alert, interactive, in no acute distress. HEENT: PERRLA, TMs pearly gray, turbinates mildly edematous without discharge, post-pharynx non erythematous. Neck: Supple without lymphadenopathy. Lungs: Clear to auscultation without wheezing, rhonchi or rales. {no increased work of breathing. CV: Normal S1, S2 without murmurs. Abdomen: Nondistended, nontender. Skin: Warm and dry, without lesions or rashes. Extremities:  No clubbing, cyanosis or edema. Neuro:   Grossly intact.  Diagnositics/Labs:  Allergy testing: Environmental allergy skin prick testing is positive to grass pollens,  rough pigweed, tree pollens. Intradermal testing is positive to ragweed mix, mold mix 2, mold mix 4. Skin prick to cow is also negative. Allergy testing results were read and interpreted by provider, documented by clinical staff.   Assessment and plan:   Allergic rhinitis with conjunctivitis  - environmental allergy skin testing today is positive to tree pollens, weed pollens, grass pollens, molds  - allergen avoidance measures discussed/handouts provided  - continue Zyrtec 10mg  daily.  Can also try Xyzal 5mg  or Allegra 180mg  if Zyrtec becomes less effective.    - for itchy/watery/red eyes Pataday 1 drop each eye daily as needed  - for nasal congestion continue use of Flonase 2 sprays each nostril daily as needed.  Use for 1-2 weeks at a time before stopping once symptoms.    - for nasal drainage/post-nasal drip use nasal antihistamine, Astelin 2 sprays each nostril twice a day as needed.    - allergen immunotherapy discussed today including protocol, benefits and risk.  Informational handout provided.  If interested in this  therapuetic option you can check with your insurance carrier for coverage.  Let us know if you would like to proceed with this option.    Recurrent sinus infections  - if you continue to have recurrent infections would recommend an immune system screen however at this time believe your symptoms are related to allergies as above  Follow-up 6 months or sooner if needed  I appreciate the opportunity to take part in Chelsey Roberts's care. Please do not hesitate to contact me with questions.  Sincerely,   Prudy Feeler, MD Allergy/Immunology [facility]

## 2018-12-14 ENCOUNTER — Encounter: Payer: Self-pay | Admitting: Family Medicine

## 2018-12-14 ENCOUNTER — Ambulatory Visit (INDEPENDENT_AMBULATORY_CARE_PROVIDER_SITE_OTHER): Payer: 59 | Admitting: Family Medicine

## 2018-12-14 ENCOUNTER — Other Ambulatory Visit: Payer: Self-pay

## 2018-12-14 DIAGNOSIS — M79671 Pain in right foot: Secondary | ICD-10-CM

## 2018-12-14 DIAGNOSIS — M79672 Pain in left foot: Secondary | ICD-10-CM

## 2018-12-14 MED ORDER — TRAMADOL HCL 50 MG PO TABS: 50.0000 mg | ORAL_TABLET | Freq: Four times a day (QID) | ORAL | 0 refills | Status: DC | PRN

## 2018-12-14 NOTE — Progress Notes (Signed)
   Office Visit Note   Patient: Chelsey Roberts           Date of Birth: 1991-04-22           MRN: [id number] Visit Date: 12/14/2018 Requested by: No referring provider defined for this encounter. PCP: Patient, No Pcp Per  Subjective: Chief Complaint  Patient presents with  . bilateral heel pain, plantar aspect    HPI: She is here with bilateral heel pain.  Longstanding problems with her heels, but in the past few months she has had a significant flareup that does not seem to be going away.  Plantar pain worse when for starting to walk, but even hurting at the end of the day.  She tried different shoes with minimal improvement.  She is tried over-the-counter anti-inflammatories.  No risk factor for stress fractures.              ROS: No fevers or chills.  All other systems were reviewed and are negative.  Objective: Vital Signs: There were no vitals taken for this visit.  Physical Exam:  General:  Alert and oriented, in no acute distress. Pulm:  Breathing unlabored. Psy:  Normal mood, congruent affect. Skin: No rash on skin. Heels: She has moderately tight plantar fascia bilaterally.  Exquisite tenderness at the medial origin plantar fascia on both sides.  No pain with flexion of the toes against resistance.  No tenderness with medial/lateral calcaneal squeeze.  Imaging: Musculoskeletal ultrasound bilateral heels: There is thickening of the plantar fascia bilaterally at the calcaneal attachment.  There appears to be intrasubstance tearing on the right.   Assessment & Plan: 1.  Chronic bilateral heel pain consistent with plantar fasciitis -Discussed options with her and she wants to try bilateral injections with cortisone.  Could contemplate physical therapy or prolotherapy if symptoms persist.     Procedures: Bilateral plantar fascia injections: After sterile prep with Betadine, injected 3 cc 1% lidocaine without epinephrine and 40 mg methylprednisolone into the area of  maximum tenderness of both plantar fascia.    PMFS History: Patient Active Problem List   Diagnosis Date Noted  . Pregnant 09/04/2017   Past Medical History:  Diagnosis Date  . Headache   . History of acute cystitis   . Recurrent upper respiratory infection (URI)     Family History  Problem Relation Age of Onset  . Esophageal cancer Maternal Grandfather   . Hypertension Paternal Grandmother   . Stroke Paternal Grandmother   . Hypertension Paternal Grandfather   . Diabetes Paternal Grandfather   . Heart attack Paternal Grandfather   . Allergic rhinitis Mother   . Heart murmur Mother   . Hypertension Father   . Angioedema Neg Hx   . Asthma Neg Hx   . Atopy Neg Hx   . Eczema Neg Hx   . Immunodeficiency Neg Hx   . Urticaria Neg Hx     Past Surgical History:  Procedure Laterality Date  . NO PAST SURGERIES     Social History   Occupational History  . Not on file  Tobacco Use  . Smoking status: Never Smoker  . Smokeless tobacco: Never Used  Substance and Sexual Activity  . Alcohol use: No    Frequency: Never  . Drug use: No  . Sexual activity: Not on file

## 2018-12-14 NOTE — Addendum Note (Signed)
Addended by: Hortencia Pilar on: 12/14/2018 04:50 PM   Modules accepted: Orders

## 2019-02-01 ENCOUNTER — Other Ambulatory Visit: Payer: Self-pay | Admitting: Family Medicine

## 2019-02-01 DIAGNOSIS — M79671 Pain in right foot: Secondary | ICD-10-CM

## 2019-02-01 MED ORDER — NITROGLYCERIN 0.1 MG/HR TD PT24: MEDICATED_PATCH | TRANSDERMAL | 3 refills | Status: DC

## 2019-02-01 NOTE — Progress Notes (Signed)
Having recurrent right heel pain.  Ultrasound shows hypoechoic defect, probable partial tear at insertion.  Peroneal tendon sheath swelling as well.  Will try PT with iontophoresis and possible dry needling.  Nitroglycerin patches.  Consider prolo with dextrose if persists.

## 2019-02-11 ENCOUNTER — Other Ambulatory Visit: Payer: Self-pay | Admitting: Family Medicine

## 2019-02-11 MED ORDER — NABUMETONE 750 MG PO TABS: 750.0000 mg | ORAL_TABLET | Freq: Two times a day (BID) | ORAL | 6 refills | Status: DC | PRN

## 2019-02-22 ENCOUNTER — Ambulatory Visit: Payer: 59 | Attending: Family Medicine | Admitting: Physical Therapy

## 2019-02-22 ENCOUNTER — Other Ambulatory Visit: Payer: Self-pay

## 2019-02-22 ENCOUNTER — Encounter: Payer: Self-pay | Admitting: Physical Therapy

## 2019-02-22 DIAGNOSIS — M25571 Pain in right ankle and joints of right foot: Secondary | ICD-10-CM

## 2019-02-22 DIAGNOSIS — M25572 Pain in left ankle and joints of left foot: Secondary | ICD-10-CM

## 2019-02-22 DIAGNOSIS — R2689 Other abnormalities of gait and mobility: Secondary | ICD-10-CM | POA: Diagnosis not present

## 2019-02-22 NOTE — Therapy (Signed)
[place], [place], [address] Phone: [phone]   Fax:  [phone]  Physical Therapy Evaluation  Patient Details  Name: Chelsey Roberts MRN: [id number] Date of Birth: [birth year] Referring Provider (PT): Dr Eunice Blase   Encounter Date: 02/22/2019    Past Medical History:  Diagnosis Date  . Headache   . History of acute cystitis   . Recurrent upper respiratory infection (URI)     Past Surgical History:  Procedure Laterality Date  . NO PAST SURGERIES      There were no vitals filed for this visit.   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 She began having pain again hin her heels in March. Since then it has continued to hurt. A few weeks ago she felt like she had a tear in th ebottom of her foot. Since that point she has had significant pain. She had an injection which helepd for a short time.    Limitations  Standing;Walking    How long can you stand comfortably?  it depends on how irritated it is    How long can you walk comfortably?  depends on how itrritated it is    Currently in Pain?  Yes    Pain Score  6     Pain Location  Foot    Pain Orientation  Medial;Right;Left    Pain Descriptors / Indicators  Stabbing;Sharp    Pain Type  Chronic pain    Pain Onset  More than a month ago    Pain Frequency  Constant    Aggravating Factors   standing and walking    Pain Relieving Factors  Nothing    Effect of Pain on Daily Activities  difficulty perfroming ADL's         [facility] PT Assessment - 02/22/19 0001      Assessment   Medical Diagnosis  Bilateral plantar facititis     Referring Provider (PT)  Dr Legrand Como Hilts    Onset Date/Surgical Date  --   March 2020    Hand Dominance  Right    Next MD Visit  Nothing schedueld     Prior Therapy  None       Precautions   Precautions  None      Restrictions    Weight Bearing Restrictions  No      Balance Screen   Has the patient fallen in the past 6 months  No    Has the patient had a decrease in activity level because of a fear of falling?   No    Is the patient reluctant to leave their home because of a fear of falling?   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 Is on her feet for long periods of time during the day.       Cognition   Overall Cognitive Status  Within Functional Limits for tasks assessed    Attention  Focused    Focused Attention  Appears intact    Memory  Appears intact    Awareness  Appears intact    Problem Solving  Appears intact      Observation/Other Assessments  Observations  bilateral flat foot.     Focus on Therapeutic Outcomes (FOTO)   66% limitation 37% expected       Sensation   Light Touch  Appears Intact    Additional Comments  denies numbness and tingling       Coordination   Gross Motor Movements are Fluid and Coordinated  Yes    Fine Motor Movements are Fluid and Coordinated  Yes      ROM / Strength   AROM / PROM / Strength  AROM;PROM;Strength      AROM   AROM Assessment Site  Ankle    Right/Left Ankle  Right;Left    Right Ankle Dorsiflexion  5    Right Ankle Plantar Flexion  43    Right Ankle Inversion  23    Right Ankle Eversion  12    Left Ankle Dorsiflexion  7    Left Ankle Plantar Flexion  45    Left Ankle Inversion  26    Left Ankle Eversion  13      PROM   Overall PROM Comments  all other motions WNL; Limited passive flexion and extension of the great toe    PROM Assessment Site  Ankle    Right/Left Ankle  Right;Left    Right Ankle Dorsiflexion  10    Left Ankle Dorsiflexion  10      Strength   Overall Strength Comments  gross knee and hip 5/5     Strength Assessment Site  Ankle    Right/Left Ankle  Right;Left     Right Ankle Dorsiflexion  4+/5    Right Ankle Plantar Flexion  --   unable to test 2nd to pain    Right Ankle Inversion  4+/5    Right Ankle Eversion  4+/5    Left Ankle Dorsiflexion  4+/5    Left Ankle Plantar Flexion  --   unable to test 2nd to pain    Left Ankle Inversion  4+/5    Left Ankle Eversion  4+/5      Palpation   Palpation comment  tendenress to palapation in the medical tibial tubricle R> L. Mild trigger points into both calfs      Ambulation/Gait   Gait Comments  Bilteral supination with gait                Objective measurements completed on examination: See above findings.      [facility] Adult PT Treatment/Exercise - 02/22/19 0001      Manual Therapy   Manual Therapy  Myofascial release;Soft tissue mobilization;Passive ROM    Manual therapy comments  reviewed self manual TFM to plantarfacia    Soft tissue mobilization  to calf and plantar facia     Passive ROM  gement PROM into DF with care not to cause pain in the plantar facia       Ankle Exercises: Seated   Other Seated Ankle Exercises  towel scuntch x10 with minor pain noted     Other Seated Ankle Exercises  self great toe mobilization x47min              PT Education - 02/22/19 1237    Education Details  reviewed symptom mangement, taping: benefits and risk. HEP, POC    Person(s) Educated  Patient    Methods  Explanation;Demonstration;Tactile cues;Verbal cues    Comprehension  Verbalized understanding;Returned demonstration;Verbal cues required;Tactile cues required       PT Short Term Goals - 02/22/19 1312  PT SHORT TERM GOAL #1   Title  Patient will increase bilateral active ankle DF by 5 degrres    Time  3    Period  Weeks    Status  New    Target Date  03/15/19      PT SHORT TERM GOAL #2   Title  Patient will increase gross bilatewral ankle strength to 5/5    Time  3    Period  Weeks    Status  New    Target Date  03/15/19      PT SHORT TERM GOAL #3   Title  Patient  will report 3/10 pain at worst with ambualtion    Time  3    Period  Weeks    Status  New    Target Date  03/15/19        PT Long Term Goals - 02/22/19 1313      PT LONG TERM GOAL #1   Title  Patient will stand at work for 2 hours without increased pain    Time  6    Period  Weeks    Status  New    Target Date  04/05/19      PT LONG TERM GOAL #2   Title  Patient will walk 1 mile without increased pain in order to go shopping    Time  6    Period  Weeks    Status  New    Target Date  04/05/19      PT LONG TERM GOAL #3   Title  Patient will be independent with program to decrease pain and to continue to improve strength    Time  6    Period  Weeks    Status  New    Target Date  04/05/19             Plan - 02/22/19 1003    Clinical Impression Statement  Patient is a 28 year old [place] who presnts with bilateral plantar facia pain R>L. She has tightness in both calves as well as limitation in great tow mobility. She has tenderness to palpation bilateral in her medial calcaneal trubricle as well as PF mid belly pain on the right. She has achillies pain ast times but not today. She would benefit from skilled therapy to improve gastroc mobility, decrease PF irritation and length, and improve her ability to ambualte. She has bilateral flat foot. She has tried OTC orthotics in the past whih has made things worse. She may require a custom orthotic if her pain persists.    Examination-Activity Limitations  Stand;Stairs;Locomotion Level    Examination-Participation Restrictions  Shop;Cleaning;Meal Prep    Stability/Clinical Decision Making  Evolving/Moderate complexity   pain increasing over time   Clinical Decision Making  Low    Rehab Potential  Good    PT Frequency  2x / week    PT Duration  6 weeks    PT Treatment/Interventions  ADLs/Self Care Home Management;Cryotherapy;Electrical Stimulation;Iontophoresis 4mg /ml Dexamethasone;Moist Heat;Ultrasound;Gait training;Stair  training;Functional mobility training;Therapeutic activities;Therapeutic exercise;Patient/family education;Manual techniques;Passive range of motion;Dry needling;Taping    PT Next Visit Plan  begin manual therapy to her claf. She has been stretching but continues to have pain. Avoid agressive stretching as she has hadincreased pain. Consider IASTYM to the calf, TFM to meidal plantar facia, grest toe mobilization and stretching; focused ankle strengthening; gross LE strengthening, continue with tapin if beneifcial; Consider fat pad taping if helpful; consider ultraspund, had a bad expieirnce  with nitroglycerin patch so hold off patches at this time. Consider seated ankle exercises next visit.    PT Home Exercise Plan  great toe mobilization; Plantar facia TFM; toe scruntch    Consulted and Agree with Plan of Care  Patient       Patient will benefit from skilled therapeutic intervention in order to improve the following deficits and impairments:  Abnormal gait, Decreased range of motion, Decreased activity tolerance, Decreased mobility, Decreased strength, Decreased knowledge of precautions, Decreased safety awareness, Difficulty walking  Visit Diagnosis: Pain in right ankle and joints of right foot  Pain in left ankle and joints of left foot  Other abnormalities of gait and mobility     Problem List Patient Active Problem List   Diagnosis Date Noted  . Pregnant 09/04/2017    Carney Living  PT DPT  02/22/2019, 1:32 PM  [facility] [address] [place], [place], [address] Phone: [phone]   Fax:  [phone]  Name: Chelsey Roberts MRN: [id number] Date of Birth: Jun 08, 1991

## 2019-03-01 ENCOUNTER — Ambulatory Visit: Payer: 59 | Admitting: Physical Therapy

## 2019-03-01 ENCOUNTER — Other Ambulatory Visit: Payer: Self-pay

## 2019-03-01 DIAGNOSIS — M25572 Pain in left ankle and joints of left foot: Secondary | ICD-10-CM

## 2019-03-01 DIAGNOSIS — M25571 Pain in right ankle and joints of right foot: Secondary | ICD-10-CM | POA: Diagnosis not present

## 2019-03-01 DIAGNOSIS — R2689 Other abnormalities of gait and mobility: Secondary | ICD-10-CM

## 2019-03-01 NOTE — Therapy (Signed)
[facility] [place], [place], [address] Phone: [phone]   Fax:  [phone]  Physical Therapy Treatment  Patient Details  Name: Chelsey Roberts MRN: [id number] Date of Birth: 1990/07/05 Referring Provider (PT): Dr Eunice Blase   Encounter Date: 03/01/2019  PT End of Session - 03/01/19 0849    Visit Number  2    Number of Visits  12    Date for PT Re-Evaluation  04/05/19    Authorization Type  [facility] [employer]    PT Start Time  0847    PT Stop Time  0933    PT Time Calculation (min)  46 min    Activity Tolerance  Patient tolerated treatment well    Behavior During Therapy  [facility] for tasks assessed/performed       Past Medical History:  Diagnosis Date  . Headache   . History of acute cystitis   . Recurrent upper respiratory infection (URI)     Past Surgical History:  Procedure Laterality Date  . NO PAST SURGERIES      There were no vitals filed for this visit.  Subjective Assessment - 03/01/19 0850    Subjective  " I had to run to help my daughter which increased my soreness in the my foot. I had to take medication and ice and it helped which I am able to walk better on it"         [facility] PT Assessment - 03/01/19 0001      Assessment   Medical Diagnosis  Bilateral plantar facititis     Referring Provider (PT)  Dr Eunice Blase                   [facility] Adult PT Treatment/Exercise - 03/01/19 0001      Manual Therapy   Manual Therapy  Other (comment);Joint mobilization    Manual therapy comments  skilled palpation and monitoring or pt through TPDN   MTPR along the gastroc/ soleus x 2   Joint Mobilization  1st MCP grade III PA and gentle distraction    Soft tissue mobilization  IASTM along calf and along PF and medial calcaneal tubercle    Other Manual Therapy  arch taping on the R   opted to hold off on fat pad tape to assess response to tape     Ankle Exercises: Supine   T-Band  4 way 1 x  12 ea. with green theraband      Ankle Exercises: Stretches   Gastroc Stretch  2 reps;30 seconds   runner stretch      Trigger Point Dry Needling - 03/01/19 0001    Consent Given?  Yes    Education Handout Provided  Yes    Muscles Treated Lower Quadrant  Quadratus plantae    Quadatus plantae response  Twitch response elicited;Palpable increased muscle length           PT Education - 03/01/19 0855    Education Details  what TPDN is benefits and what it is and what to expect. reviewed previously provided HEP    Person(s) Educated  Patient    Methods  Explanation;Verbal cues    Comprehension  Verbalized understanding;Verbal cues required       PT Short Term Goals - 02/22/19 1312      PT SHORT TERM GOAL #1   Title  Patient will increase bilateral active ankle DF by 5 degrres    Time  3    Period  Weeks    Status  New    Target Date  03/15/19      PT SHORT TERM GOAL #2   Title  Patient will increase gross bilatewral ankle strength to 5/5    Time  3    Period  Weeks    Status  New    Target Date  03/15/19      PT SHORT TERM GOAL #3   Title  Patient will report 3/10 pain at worst with ambualtion    Time  3    Period  Weeks    Status  New    Target Date  03/15/19        PT Long Term Goals - 02/22/19 1313      PT LONG TERM GOAL #1   Title  Patient will stand at work for 2 hours without increased pain    Time  6    Period  Weeks    Status  New    Target Date  04/05/19      PT LONG TERM GOAL #2   Title  Patient will walk 1 mile without increased pain in order to go shopping    Time  6    Period  Weeks    Status  New    Target Date  04/05/19      PT LONG TERM GOAL #3   Title  Patient will be independent with program to decrease pain and to continue to improve strength    Time  6    Period  Weeks    Status  New    Target Date  04/05/19            Plan - 03/01/19 0947    Clinical Impression Statement  pt reports consistency with her HEP and  noting pain only at 2/10. educated and consent was given forTPDN focusing on the quadratus plantae followed with IASTM techniques. Worked on ankle strengthening, and stretching, trialed arch taping which she noted relief of pain, held off on fat pad taping to assess response to both techiques.    PT Treatment/Interventions  ADLs/Self Care Home Management;Cryotherapy;Electrical Stimulation;Iontophoresis 4mg /ml Dexamethasone;Moist Heat;Ultrasound;Gait training;Stair training;Functional mobility training;Therapeutic activities;Therapeutic exercise;Patient/family education;Manual techniques;Passive range of motion;Dry needling;Taping    PT Next Visit Plan  begin manual therapy to her claf. response to DN along the quadratus Plantae, TFM to meidal plantar facia, grest toe mobilization and stretching; focused ankle strengthening; gross LE strengthening, continue with tapin if beneifcial; Consider fat pad taping if helpful; consider ultraspund, had a bad expieirnce with nitroglycerin patch so hold off patches at this time. Consider seated ankle exercises next visit.    PT Home Exercise Plan  great toe mobilization; Plantar facia TFM; toe scruntch    Consulted and Agree with Plan of Care  Patient       Patient will benefit from skilled therapeutic intervention in order to improve the following deficits and impairments:  Abnormal gait, Decreased range of motion, Decreased activity tolerance, Decreased mobility, Decreased strength, Decreased knowledge of precautions, Decreased safety awareness, Difficulty walking  Visit Diagnosis: Pain in right ankle and joints of right foot  Pain in left ankle and joints of left foot  Other abnormalities of gait and mobility     Problem List Patient Active Problem List   Diagnosis Date Noted  . Pregnant 09/04/2017   Starr Lake PT, DPT, LAT, ATC  03/01/19  9:53 AM      [place]  [place], [place], [address] Phone: [phone]   Fax:  [phone]  Name: Chelsey Roberts MRN: [id number] Date of Birth: [birth year]

## 2019-03-03 ENCOUNTER — Other Ambulatory Visit: Payer: Self-pay

## 2019-03-03 ENCOUNTER — Ambulatory Visit: Payer: 59 | Admitting: Allergy

## 2019-03-03 ENCOUNTER — Encounter: Payer: Self-pay | Admitting: Allergy

## 2019-03-03 VITALS — BP 112/82 | HR 85 | Temp 97.6°F | Resp 16 | Ht 62.0 in | Wt 176.0 lb

## 2019-03-03 DIAGNOSIS — J3089 Other allergic rhinitis: Secondary | ICD-10-CM | POA: Diagnosis not present

## 2019-03-03 DIAGNOSIS — H1013 Acute atopic conjunctivitis, bilateral: Secondary | ICD-10-CM | POA: Diagnosis not present

## 2019-03-03 NOTE — Progress Notes (Signed)
Follow-up Note  RE: Chelsey Roberts MRN: [id number] DOB: Sep 03, 1990 Date of Office Visit: 03/03/2019   History of present illness: Chelsey Roberts is a 28 y.o. female presenting today for follow-up of allergic rhinitis with conjunctivitis and history of recurrent sinus infections.  She was last seen in the office on 09/03/2018 by myself.  She states she is doing much better since last visit.  She states she takes zyrtec daily.  She states when she does have congestion or drainage she will use nasal saline and then follow with either flonase or astelin depending on symptoms.  She states this helps greatly.  She has not had any sinus infections or antibiotic needs since last visit.   She denies any major health changes, surgeries or hospitalizations since last visit.    Review of systems: Review of Systems  Constitutional: Negative for chills, fever and malaise/fatigue.  HENT: Negative for congestion, ear discharge, nosebleeds and sore throat.   Eyes: Negative for pain, discharge and redness.  Respiratory: Negative for cough, shortness of breath and wheezing.   Cardiovascular: Negative for chest pain.  Gastrointestinal: Negative.   Musculoskeletal: Negative.   Skin: Negative for itching and rash.  Neurological: Negative.     All other systems negative unless noted above in HPI  Past medical/social/surgical/family history have been reviewed and are unchanged unless specifically indicated below.  No changes  Medication List: Allergies as of 03/03/2019      Reactions   Tamiflu [oseltamivir Phosphate] Nausea And Vomiting      Medication List       Accurate as of March 03, 2019  5:16 PM. If you have any questions, ask your nurse or doctor.        STOP taking these medications   nitroGLYCERIN 0.1 mg/hr patch Commonly known as: Nitro-Dur Stopped by:  Charmian Muff, MD   traMADol 50 MG tablet Commonly known as: ULTRAM Stopped by:  Charmian Muff, MD     TAKE these medications   acetaminophen 500 MG tablet Commonly known as: TYLENOL Take 1,000 mg by mouth every 6 (six) hours as needed for headache.   Estarylla 0.25-35 MG-MCG tablet Generic drug: norgestimate-ethinyl estradiol Take 1 tablet by mouth as directed.   fluticasone 50 MCG/ACT nasal spray Commonly known as: FLONASE Place 2 sprays into both nostrils daily.   ibuprofen 600 MG tablet Commonly known as: ADVIL Take 1 tablet (600 mg total) by mouth every 6 (six) hours.   nabumetone 750 MG tablet Commonly known as: RELAFEN Take 1 tablet (750 mg total) by mouth 2 (two) times daily as needed.   Olopatadine HCl 0.2 % Soln Apply 1 drop to eye daily as needed.   prenatal multivitamin Tabs tablet Take 1 tablet by mouth daily at 12 noon.       Known medication allergies: Allergies  Allergen Reactions  . Tamiflu [Oseltamivir Phosphate] Nausea And Vomiting     Physical examination: Blood pressure 112/82, pulse 85, temperature 97.6 F (36.4 C), temperature source Temporal, resp. rate 16, height 5\' 2"  (1.575 m), weight 176 lb (79.8 kg), SpO2 97 %, unknown if currently breastfeeding.  General: Alert, interactive, in no acute distress. HEENT: PERRLA, TMs pearly gray, turbinates non-edematous without discharge, post-pharynx non erythematous. Neck: Supple without lymphadenopathy. Lungs: Clear to auscultation without wheezing, rhonchi or rales. {no increased work of breathing. CV: Normal S1, S2 without murmurs. Abdomen: Nondistended, nontender. Skin: Warm and dry, without lesions or rashes. Extremities:  No clubbing, cyanosis or edema. Neuro:  Grossly intact.  Diagnositics/Labs: None today  Assessment and plan:   Allergic rhinitis with conjunctivitis  - continue avoidance measures for tree pollens, weed pollens, grass pollens, molds  - continue Zyrtec 10mg  daily.  Can also try Xyzal 5mg  or Allegra 180mg  if Zyrtec becomes less effective.    - for itchy/watery/red  eyes Pataday 1 drop each eye daily as needed  - for nasal congestion continue use of Flonase 2 sprays each nostril daily as needed.  Use for 1-2 weeks at a time before stopping once symptoms.    - for nasal drainage/post-nasal drip use nasal antihistamine, Astelin 2 sprays each nostril twice a day as needed.    - allergen immunotherapy has been discussed previously.  Consider if medication management is ineffective.    Recurrent sinus infections  - if you continue to have recurrent infections would recommend an immune system screen   Follow-up 6 months or sooner if needed  I appreciate the opportunity to take part in Chelsey Roberts's care. Please do not hesitate to contact me with questions.  Sincerely,   Prudy Feeler, MD Allergy/Immunology [facility]

## 2019-03-03 NOTE — Patient Instructions (Signed)
Allergies   - continue avoidance measures for tree pollens, weed pollens, grass pollens, molds  - continue Zyrtec 10mg  daily.  Can also try Xyzal 5mg  or Allegra 180mg  if Zyrtec becomes less effective.    - for itchy/watery/red eyes Pataday 1 drop each eye daily as needed  - for nasal congestion continue use of Flonase 2 sprays each nostril daily as needed.  Use for 1-2 weeks at a time before stopping once symptoms.    - for nasal drainage/post-nasal drip use nasal antihistamine, Astelin 2 sprays each nostril twice a day as needed.    - allergen immunotherapy has been discussed previously.  Consider if medication management is ineffective.    Recurrent sinus infections  - if you continue to have recurrent infections would recommend an immune system screen   Follow-up 6 months or sooner if needed

## 2019-03-09 ENCOUNTER — Other Ambulatory Visit: Payer: Self-pay

## 2019-03-09 ENCOUNTER — Ambulatory Visit: Payer: 59 | Admitting: Physical Therapy

## 2019-03-09 ENCOUNTER — Encounter: Payer: Self-pay | Admitting: Physical Therapy

## 2019-03-09 DIAGNOSIS — R2689 Other abnormalities of gait and mobility: Secondary | ICD-10-CM | POA: Diagnosis not present

## 2019-03-09 DIAGNOSIS — M25572 Pain in left ankle and joints of left foot: Secondary | ICD-10-CM | POA: Diagnosis not present

## 2019-03-09 DIAGNOSIS — M25571 Pain in right ankle and joints of right foot: Secondary | ICD-10-CM

## 2019-03-09 NOTE — Therapy (Signed)
[facility] [place], [place], [address] Phone: [phone]   Fax:  [phone]  Physical Therapy Treatment  Patient Details  Name: Chelsey Roberts MRN: [id number] Date of Birth: Jul 18, 1990 Referring Provider (PT): Dr Eunice Blase   Encounter Date: 03/09/2019  PT End of Session - 03/09/19 1239    Visit Number  3    Number of Visits  12    Date for PT Re-Evaluation  04/05/19    Authorization Type  [facility]    PT Start Time  1015    PT Stop Time  1058    PT Time Calculation (min)  43 min    Activity Tolerance  Patient tolerated treatment well    Behavior During Therapy  [facility] for tasks assessed/performed       Past Medical History:  Diagnosis Date  . Headache   . History of acute cystitis   . Recurrent upper respiratory infection (URI)     Past Surgical History:  Procedure Laterality Date  . NO PAST SURGERIES      There were no vitals filed for this visit.  Subjective Assessment - 03/09/19 1018    Subjective  Patient was on vacation and did a lot ofhiking. Her feet held up very well. She was alittle sore but she dosent feel like she is having a ripping problem. She reported some soreness after the needling but it was improved after. She ggot new shoes which has made a huge difference.    Limitations  Standing;Walking    How long can you stand comfortably?  it depends on how irritated it is    How long can you walk comfortably?  depends on how itrritated it is    Currently in Pain?  Yes    Pain Score  2     Pain Location  --   riht 4th and 5th toes   Pain Orientation  Right;Left    Pain Descriptors / Indicators  Stabbing;Cramping    Pain Type  Acute pain    Pain Onset  More than a month ago    Pain Frequency  Intermittent    Aggravating Factors   standing and walking    Pain Relieving Factors  just comes and goes    Effect of Pain on Daily Activities  difficulty perfroming ADL's                        [facility] Adult PT Treatment/Exercise - 03/09/19 0001      Self-Care   Self-Care  Other Self-Care Comments    Other Self-Care Comments   Patient is ahving a cramping feeling between her 4th and 5th digits. She wasshown how to lightly stretch and was advised that a toe small foam toe spacer may be beneifcial.       Manual Therapy   Joint Mobilization  gentrle mobilization of the 4th and 5th Tarsals    Soft tissue mobilization  ISTYM to calf and medial calaneal area;     Passive ROM  genlte into DF     Other Manual Therapy  arch taping on the R      Ankle Exercises: Seated   Other Seated Ankle Exercises  towel scuntch x10 with minor pain noted       Ankle Exercises: Supine   T-Band  4 way 1 x 12 ea. with green theraband Given for home  PT Education - 03/09/19 1238    Education Details  self sot tissue mobilization to calf and TFM to plantar facia    Person(s) Educated  Patient    Methods  Explanation;Demonstration;Tactile cues;Verbal cues    Comprehension  Verbalized understanding;Verbal cues required;Tactile cues required;Returned demonstration       PT Short Term Goals - 02/22/19 1312      PT SHORT TERM GOAL #1   Title  Patient will increase bilateral active ankle DF by 5 degrres    Time  3    Period  Weeks    Status  New    Target Date  03/15/19      PT SHORT TERM GOAL #2   Title  Patient will increase gross bilatewral ankle strength to 5/5    Time  3    Period  Weeks    Status  New    Target Date  03/15/19      PT SHORT TERM GOAL #3   Title  Patient will report 3/10 pain at worst with ambualtion    Time  3    Period  Weeks    Status  New    Target Date  03/15/19        PT Long Term Goals - 02/22/19 1313      PT LONG TERM GOAL #1   Title  Patient will stand at work for 2 hours without increased pain    Time  6    Period  Weeks    Status  New    Target Date  04/05/19      PT LONG TERM GOAL #2   Title   Patient will walk 1 mile without increased pain in order to go shopping    Time  6    Period  Weeks    Status  New    Target Date  04/05/19      PT LONG TERM GOAL #3   Title  Patient will be independent with program to decrease pain and to continue to improve strength    Time  6    Period  Weeks    Status  New    Target Date  04/05/19            Plan - 03/09/19 1240    Clinical Impression Statement  Patient has improved pain in her medial plantar facia area. Needling was held today but we will try again Thursday depending on her progress. Therapy taped her foot todsay and gave her gross strengthening for home.    Examination-Activity Limitations  Stand;Stairs;Locomotion Level    Examination-Participation Restrictions  Shop;Cleaning;Meal Prep    Stability/Clinical Decision Making  Evolving/Moderate complexity    Clinical Decision Making  Low    Rehab Potential  Good    PT Frequency  2x / week    PT Duration  6 weeks    PT Treatment/Interventions  ADLs/Self Care Home Management;Cryotherapy;Electrical Stimulation;Iontophoresis 4mg /ml Dexamethasone;Moist Heat;Ultrasound;Gait training;Stair training;Functional mobility training;Therapeutic activities;Therapeutic exercise;Patient/family education;Manual techniques;Passive range of motion;Dry needling;Taping    PT Next Visit Plan  begin manual therapy to her claf. response to DN along the quadratus Plantae, TFM to meidal plantar facia, grest toe mobilization and stretching; focused ankle strengthening; gross LE strengthening, continue with tapin if beneifcial; Consider fat pad taping if helpful; consider ultraspund, had a bad expieirnce with nitroglycerin patch so hold off patches at this time. Consider seated ankle exercises next visit.    PT Home Exercise Plan  great toe mobilization;  Plantar facia TFM; toe scruntch    Consulted and Agree with Plan of Care  Patient       Patient will benefit from skilled therapeutic intervention in  order to improve the following deficits and impairments:  Abnormal gait, Decreased range of motion, Decreased activity tolerance, Decreased mobility, Decreased strength, Decreased knowledge of precautions, Decreased safety awareness, Difficulty walking  Visit Diagnosis: Pain in right ankle and joints of right foot  Pain in left ankle and joints of left foot  Other abnormalities of gait and mobility     Problem List Patient Active Problem List   Diagnosis Date Noted  . Pregnant 09/04/2017    Carney Living PT DPT  03/09/2019, 1:26 PM  [facility] [address] [place], [place], [address] Phone: [phone]   Fax:  [phone]  Name: NIKIAH DOMINSKI MRN: [id number] Date of Birth: [birth year]

## 2019-03-11 ENCOUNTER — Other Ambulatory Visit: Payer: Self-pay

## 2019-03-11 ENCOUNTER — Ambulatory Visit: Payer: 59 | Attending: Family Medicine | Admitting: Physical Therapy

## 2019-03-11 DIAGNOSIS — M25572 Pain in left ankle and joints of left foot: Secondary | ICD-10-CM | POA: Insufficient documentation

## 2019-03-11 DIAGNOSIS — M25571 Pain in right ankle and joints of right foot: Secondary | ICD-10-CM | POA: Insufficient documentation

## 2019-03-11 DIAGNOSIS — R2689 Other abnormalities of gait and mobility: Secondary | ICD-10-CM | POA: Diagnosis not present

## 2019-03-12 ENCOUNTER — Encounter: Payer: Self-pay | Admitting: Physical Therapy

## 2019-03-12 NOTE — Therapy (Signed)
[place], [place], [address] Phone: [phone]   Fax:  [phone]  Physical Therapy Treatment  Patient Details  Name: Chelsey Roberts MRN: [id number] Date of Birth: Jan 30, 1991 Referring Provider (PT): Dr Eunice Blase   Encounter Date: 03/11/2019  PT End of Session - 03/12/19 0906    Visit Number  4    Number of Visits  12    Date for PT Re-Evaluation  04/05/19    Authorization Type  [facility]    PT Start Time  0430    PT Stop Time  0512    PT Time Calculation (min)  42 min    Activity Tolerance  Patient tolerated treatment well    Behavior During Therapy  [facility] for tasks assessed/performed       Past Medical History:  Diagnosis Date  . Headache   . History of acute cystitis   . Recurrent upper respiratory infection (URI)     Past Surgical History:  Procedure Laterality Date  . NO PAST SURGERIES      There were no vitals filed for this visit.  Subjective Assessment - 03/12/19 0848    Subjective  Patient has been feeling better. She was on her feet all day today and is a little sore. She continues to have some soreness on the outside of her foot around the peronus bevis tendon insertion.    Patient is accompained by:  Family member    Limitations  Standing;Walking    How long can you stand comfortably?  it depends on how irritated it is    How long can you walk comfortably?  depends on how itrritated it is    Currently in Pain?  Yes    Pain Score  3     Pain Location  Ankle    Pain Orientation  Right    Pain Descriptors / Indicators  Aching    Pain Type  Chronic pain    Pain Onset  More than a month ago    Pain Frequency  Intermittent    Aggravating Factors   standing and walking    Pain Relieving Factors  just comes and goes    Effect of Pain on Daily Activities  difficulty perfroming ADL;s                       [facility] Adult PT Treatment/Exercise - 03/12/19 0001      Modalities   Modalities  Iontophoresis      Iontophoresis   Type of Iontophoresis  Dexamethasone    Location  to peroneal brevis     Dose  1 CC     Time  slow release       Manual Therapy   Joint Mobilization  gentle mobilization of the 4th and 5th Tarsals    Soft tissue mobilization  ISTYM to calf and medial calaneal area;     Passive ROM  genlte into DF     Other Manual Therapy  arch taping on the R      Ankle Exercises: Seated   Other Seated Ankle Exercises  towel scuntch x10 with minor pain noted; pF with inversion for posterior tib 2x10; widesheild wiper 2x10; seated heel raise x10       Trigger Point Dry Needling - 03/12/19 0001    Consent Given?  Yes    Education Handout Provided  Yes    Muscles Treated Lower Quadrant  Quadratus plantae  Dry Needling Comments  40x25 2 spots     Quadatus plantae response  Twitch response elicited;Palpable increased muscle length           PT Education - 03/12/19 0905    Education Details  reviewed HEP and symptom management; benefits and risks of TPDN and ionto    Person(s) Educated  Patient    Methods  Explanation;Demonstration;Tactile cues;Verbal cues    Comprehension  Verbalized understanding;Returned demonstration;Tactile cues required;Verbal cues required       PT Short Term Goals - 03/12/19 0915      PT SHORT TERM GOAL #1   Title  Patient will increase bilateral active ankle DF by 5 degrres    Time  3    Period  Weeks    Status  On-going      PT SHORT TERM GOAL #2   Title  Patient will increase gross bilatewral ankle strength to 5/5    Time  3    Period  Weeks    Status  On-going    Target Date  03/15/19      PT SHORT TERM GOAL #3   Title  Patient will report 3/10 pain at worst with ambualtion    Time  3    Period  Weeks    Status  On-going        PT Long Term Goals - 02/22/19 1313      PT LONG TERM GOAL #1   Title  Patient will stand at work for 2 hours without increased pain    Time  6    Period   Weeks    Status  New    Target Date  04/05/19      PT LONG TERM GOAL #2   Title  Patient will walk 1 mile without increased pain in order to go shopping    Time  6    Period  Weeks    Status  New    Target Date  04/05/19      PT LONG TERM GOAL #3   Title  Patient will be independent with program to decrease pain and to continue to improve strength    Time  6    Period  Weeks    Status  New    Target Date  04/05/19            Plan - 03/12/19 0915    Clinical Impression Statement  Therapy used Ionto to adress this area. Therapy also continued to work on dry needling. She was needled in 2 spots of her quadratus plantae. She had a good twtich respose but less then the other day. She had some soreness with seated ankle exercises. She was advised to continue exercises at home.    Examination-Activity Limitations  Stand;Stairs;Locomotion Level    Examination-Participation Restrictions  Shop;Cleaning;Meal Prep    Stability/Clinical Decision Making  Evolving/Moderate complexity    Clinical Decision Making  Low    Rehab Potential  Good    PT Frequency  2x / week    PT Duration  6 weeks    PT Treatment/Interventions  ADLs/Self Care Home Management;Cryotherapy;Electrical Stimulation;Iontophoresis 4mg /ml Dexamethasone;Moist Heat;Ultrasound;Gait training;Stair training;Functional mobility training;Therapeutic activities;Therapeutic exercise;Patient/family education;Manual techniques;Passive range of motion;Dry needling;Taping    PT Next Visit Plan  begin manual therapy to her claf. response to DN along the quadratus Plantae, TFM to meidal plantar facia, grest toe mobilization and stretching; focused ankle strengthening; gross LE strengthening, continue with tapin if beneifcial; Consider fat pad taping  if helpful; consider ultraspund, had a bad expieirnce with nitroglycerin patch so hold off patches at this time. Consider seated ankle exercises next visit.    PT Home Exercise Plan  great toe  mobilization; Plantar facia TFM; toe scruntch    Consulted and Agree with Plan of Care  Patient       Patient will benefit from skilled therapeutic intervention in order to improve the following deficits and impairments:  Abnormal gait, Decreased range of motion, Decreased activity tolerance, Decreased mobility, Decreased strength, Decreased knowledge of precautions, Decreased safety awareness, Difficulty walking  Visit Diagnosis: Pain in right ankle and joints of right foot  Pain in left ankle and joints of left foot  Other abnormalities of gait and mobility     Problem List Patient Active Problem List   Diagnosis Date Noted  . Pregnant 09/04/2017    Carney Living PT DPT  03/12/2019, 9:17 AM  [facility] [address] [place], [place], [address] Phone: [phone]   Fax:  [phone]  Name: Chelsey Roberts MRN: [id number] Date of Birth: 1990/11/17

## 2019-03-15 ENCOUNTER — Encounter: Payer: Self-pay | Admitting: Physical Therapy

## 2019-03-15 ENCOUNTER — Other Ambulatory Visit: Payer: Self-pay

## 2019-03-15 ENCOUNTER — Ambulatory Visit: Payer: 59 | Admitting: Physical Therapy

## 2019-03-15 DIAGNOSIS — M25572 Pain in left ankle and joints of left foot: Secondary | ICD-10-CM

## 2019-03-15 DIAGNOSIS — R2689 Other abnormalities of gait and mobility: Secondary | ICD-10-CM

## 2019-03-15 DIAGNOSIS — M25571 Pain in right ankle and joints of right foot: Secondary | ICD-10-CM

## 2019-03-15 NOTE — Therapy (Signed)
[place], [place], [address] Phone: [phone]   Fax:  [phone]  Physical Therapy Treatment  Patient Details  Name: Chelsey Roberts MRN: [id number] Date of Birth: 1990-09-09 Referring Provider (PT): Dr Eunice Blase   Encounter Date: 03/15/2019  PT End of Session - 03/15/19 0851    Visit Number  5    Number of Visits  12    Date for PT Re-Evaluation  04/05/19    Authorization Type  [facility]    PT Start Time  0845    PT Stop Time  0926    PT Time Calculation (min)  41 min    Activity Tolerance  Patient tolerated treatment well    Behavior During Therapy  [facility] for tasks assessed/performed       Past Medical History:  Diagnosis Date  . Headache   . History of acute cystitis   . Recurrent upper respiratory infection (URI)     Past Surgical History:  Procedure Laterality Date  . NO PAST SURGERIES      There were no vitals filed for this visit.  Subjective Assessment - 03/15/19 0849    Subjective  Patient was a little sitff this morning but feels like she is improving. She did a lot of house work this weekend. She was able to do it but had to take a rest at times.    Patient is accompained by:  Family member    Limitations  Standing;Walking    How long can you stand comfortably?  it depends on how irritated it is    How long can you walk comfortably?  depends on how itrritated it is    Currently in Pain?  No/denies   Just stiff                      [facility] Adult PT Treatment/Exercise - 03/15/19 0001      Modalities   Modalities  Iontophoresis      Iontophoresis   Type of Iontophoresis  Dexamethasone    Location  to peroneal brevis     Dose  1 CC     Time  slow release       Manual Therapy   Joint Mobilization  gentle mobilization of the 4th and 5th Tarsals    Soft tissue mobilization  ISTYM to calf and medial calaneal area;     Passive ROM  genlte into DF     Other  Manual Therapy  arch taping on the R      Ankle Exercises: Standing   Other Standing Ankle Exercises  low range heel raise 2x10; step onto air-ex forward and side 2x10 each way; single leg stance 3x20 sec hold each leg       Ankle Exercises: Seated   Other Seated Ankle Exercises  towel scuntch x10 with minor pain noted; pF with inversion for posterior tib 2x10; widesheild wiper 2x10; seated heel raise x10             PT Education - 03/15/19 0850    Education Details  reviewed exercises    Person(s) Educated  Patient    Methods  Explanation;Demonstration;Tactile cues;Verbal cues    Comprehension  Verbalized understanding;Returned demonstration;Verbal cues required;Tactile cues required       PT Short Term Goals - 03/15/19 1006      PT SHORT TERM GOAL #1   Title  Patient will increase bilateral active ankle DF by 5  degrres    Time  3    Period  Weeks    Status  On-going      PT SHORT TERM GOAL #2   Title  Patient will increase gross bilatewral ankle strength to 5/5    Time  3    Period  Weeks    Status  On-going    Target Date  03/15/19      PT SHORT TERM GOAL #3   Title  Patient will report 3/10 pain at worst with ambualtion    Time  3    Period  Weeks    Status  On-going        PT Long Term Goals - 02/22/19 1313      PT LONG TERM GOAL #1   Title  Patient will stand at work for 2 hours without increased pain    Time  6    Period  Weeks    Status  New    Target Date  04/05/19      PT LONG TERM GOAL #2   Title  Patient will walk 1 mile without increased pain in order to go shopping    Time  6    Period  Weeks    Status  New    Target Date  04/05/19      PT LONG TERM GOAL #3   Title  Patient will be independent with program to decrease pain and to continue to improve strength    Time  6    Period  Weeks    Status  New    Target Date  04/05/19            Plan - 03/15/19 [id number]    Clinical Impression Statement  Patient continues to make progress.  She is having less trigger points in her calfs and less TTP in her plantar facia. She is having very little pain in her left at all. Therapy continues to focus on manual therapy but instability work was addeed today. She had no increase in pain.    Examination-Activity Limitations  Stand;Stairs;Locomotion Level    Examination-Participation Restrictions  Shop;Cleaning;Meal Prep    Stability/Clinical Decision Making  Evolving/Moderate complexity    Clinical Decision Making  Low    Rehab Potential  Good    PT Treatment/Interventions  ADLs/Self Care Home Management;Cryotherapy;Electrical Stimulation;Iontophoresis 4mg /ml Dexamethasone;Moist Heat;Ultrasound;Gait training;Stair training;Functional mobility training;Therapeutic activities;Therapeutic exercise;Patient/family education;Manual techniques;Passive range of motion;Dry needling;Taping    PT Next Visit Plan  continue with manual therapy. Consider taping next visit. Continue with ionto if beneifcial.    PT Home Exercise Plan  great toe mobilization; Plantar facia TFM; toe scruntch    Consulted and Agree with Plan of Care  Patient       Patient will benefit from skilled therapeutic intervention in order to improve the following deficits and impairments:  Abnormal gait, Decreased range of motion, Decreased activity tolerance, Decreased mobility, Decreased strength, Decreased knowledge of precautions, Decreased safety awareness, Difficulty walking  Visit Diagnosis: Pain in right ankle and joints of right foot  Pain in left ankle and joints of left foot  Other abnormalities of gait and mobility     Problem List Patient Active Problem List   Diagnosis Date Noted  . Pregnant 09/04/2017    Carney Living PT DPT  03/15/2019, 11:01 AM  [facility] [address] [place], [place], [address] Phone: [phone]   Fax:  [phone]  Name: Chelsey Roberts MRN: [id number]  Date of Birth:  05-23-1991

## 2019-03-18 ENCOUNTER — Encounter: Payer: Self-pay | Admitting: Physical Therapy

## 2019-03-18 ENCOUNTER — Other Ambulatory Visit: Payer: Self-pay

## 2019-03-18 ENCOUNTER — Ambulatory Visit: Payer: 59 | Admitting: Physical Therapy

## 2019-03-18 DIAGNOSIS — M25572 Pain in left ankle and joints of left foot: Secondary | ICD-10-CM | POA: Diagnosis not present

## 2019-03-18 DIAGNOSIS — R2689 Other abnormalities of gait and mobility: Secondary | ICD-10-CM | POA: Diagnosis not present

## 2019-03-18 DIAGNOSIS — M25571 Pain in right ankle and joints of right foot: Secondary | ICD-10-CM

## 2019-03-19 NOTE — Therapy (Signed)
[place], [place], [address] Phone: [phone]   Fax:  [phone]  Physical Therapy Treatment  Patient Details  Name: Chelsey Roberts MRN: [id number] Date of Birth: Oct 12, 1990 Referring Provider (PT): Dr Eunice Blase   Encounter Date: 03/18/2019  PT End of Session - 03/18/19 1640    Visit Number  6    Number of Visits  12    Date for PT Re-Evaluation  04/05/19    PT Start Time  1630    PT Stop Time  1710    PT Time Calculation (min)  40 min    Activity Tolerance  Patient tolerated treatment well    Behavior During Therapy  [facility] for tasks assessed/performed       Past Medical History:  Diagnosis Date  . Headache   . History of acute cystitis   . Recurrent upper respiratory infection (URI)     Past Surgical History:  Procedure Laterality Date  . NO PAST SURGERIES      There were no vitals filed for this visit.  Subjective Assessment - 03/18/19 1637    Subjective  Patient feels like her pain is decreasing. She just has a little bit of soreness when she is up for a while. She is still having stiffness in the morning. She also still can not walk without shoes on.    Patient is accompained by:  Family member    Limitations  Standing;Walking    How long can you stand comfortably?  it depends on how irritated it is    How long can you walk comfortably?  depends on how itrritated it is    Currently in Pain?  No/denies                       [facility] Adult PT Treatment/Exercise - 03/19/19 0001      Iontophoresis   Type of Iontophoresis  Dexamethasone    Location  to peroneal brevis     Dose  1 CC     Time  slow release       Manual Therapy   Joint Mobilization  gentle mobilization of the 4th and 5th Tarsals    Soft tissue mobilization  ISTYM to calf and medial calaneal area;     Passive ROM  genlte into DF       Ankle Exercises: Standing   Other Standing Ankle Exercises  low  range heel raise 2x10; step onto air-ex forward and side 2x10 each way; single leg stance 3x20 sec hold each leg       Ankle Exercises: Seated   Other Seated Ankle Exercises  towel scuntch x10 with minor pain noted; pF with inversion for posterior tib 2x10; widesheild wiper 2x10; seated heel raise x10             PT Education - 03/19/19 1223    Education Details  reviewed home exercises    Person(s) Educated  Patient    Methods  Explanation;Demonstration;Tactile cues;Verbal cues    Comprehension  Verbalized understanding;Returned demonstration;Verbal cues required;Tactile cues required       PT Short Term Goals - 03/15/19 1006      PT SHORT TERM GOAL #1   Title  Patient will increase bilateral active ankle DF by 5 degrres    Time  3    Period  Weeks    Status  On-going      PT SHORT TERM GOAL #2  Title  Patient will increase gross bilatewral ankle strength to 5/5    Time  3    Period  Weeks    Status  On-going    Target Date  03/15/19      PT SHORT TERM GOAL #3   Title  Patient will report 3/10 pain at worst with ambualtion    Time  3    Period  Weeks    Status  On-going        PT Long Term Goals - 02/22/19 1313      PT LONG TERM GOAL #1   Title  Patient will stand at work for 2 hours without increased pain    Time  6    Period  Weeks    Status  New    Target Date  04/05/19      PT LONG TERM GOAL #2   Title  Patient will walk 1 mile without increased pain in order to go shopping    Time  6    Period  Weeks    Status  New    Target Date  04/05/19      PT LONG TERM GOAL #3   Title  Patient will be independent with program to decrease pain and to continue to improve strength    Time  6    Period  Weeks    Status  New    Target Date  04/05/19            Plan - 03/18/19 1707    Clinical Impression Statement  Patients sorness continues to decrease. Per visual inspection her right DF is still limitred.but improving. Therapy continues to focus on  releaseing her gastroc using IATYM and trigger point release. She continues to have a spot around her peroneal inservtion that is sore. It is improving with ionto. She is having less tenderness to palpation in that area. she can feel it when she deos winshiled wipres. She was encouraged to conti    Examination-Activity Limitations  Stand;Stairs;Locomotion Level    Examination-Participation Restrictions  Shop;Cleaning;Meal Prep    Stability/Clinical Decision Making  Evolving/Moderate complexity    Clinical Decision Making  Low    Rehab Potential  Good    PT Frequency  2x / week    PT Duration  6 weeks    PT Treatment/Interventions  ADLs/Self Care Home Management;Cryotherapy;Electrical Stimulation;Iontophoresis 4mg /ml Dexamethasone;Moist Heat;Ultrasound;Gait training;Stair training;Functional mobility training;Therapeutic activities;Therapeutic exercise;Patient/family education;Manual techniques;Passive range of motion;Dry needling;Taping    PT Next Visit Plan  continue with manual therapy. Consider taping next visit. Continue with ionto if beneifcial.    PT Home Exercise Plan  great toe mobilization; Plantar facia TFM; toe scruntch    Consulted and Agree with Plan of Care  Patient       Patient will benefit from skilled therapeutic intervention in order to improve the following deficits and impairments:  Abnormal gait, Decreased range of motion, Decreased activity tolerance, Decreased mobility, Decreased strength, Decreased knowledge of precautions, Decreased safety awareness, Difficulty walking  Visit Diagnosis: Pain in right ankle and joints of right foot  Pain in left ankle and joints of left foot  Other abnormalities of gait and mobility     Problem List Patient Active Problem List   Diagnosis Date Noted  . Pregnant 09/04/2017    Carney Living PT DPT  03/19/2019, 12:25 PM  [facility] [address] [place], [place],  [address] Phone: [phone]   Fax:  [phone]  Name: Chelsey Roberts MRN: [id number] Date of Birth: [birth year]

## 2019-03-22 ENCOUNTER — Encounter: Payer: Self-pay | Admitting: Physical Therapy

## 2019-03-22 ENCOUNTER — Other Ambulatory Visit: Payer: Self-pay

## 2019-03-22 ENCOUNTER — Ambulatory Visit: Payer: 59 | Admitting: Physical Therapy

## 2019-03-22 DIAGNOSIS — M25572 Pain in left ankle and joints of left foot: Secondary | ICD-10-CM | POA: Diagnosis not present

## 2019-03-22 DIAGNOSIS — R2689 Other abnormalities of gait and mobility: Secondary | ICD-10-CM

## 2019-03-22 DIAGNOSIS — M25571 Pain in right ankle and joints of right foot: Secondary | ICD-10-CM | POA: Diagnosis not present

## 2019-03-22 NOTE — Therapy (Signed)
[place], [place], [address] Phone: [phone]   Fax:  [phone]  Physical Therapy Treatment  Patient Details  Name: TASHIMA MAGNUSSEN MRN: [id number] Date of Birth: 01-Jan-1991 Referring Provider (PT): Dr Eunice Blase   Encounter Date: 03/22/2019  PT End of Session - 03/22/19 0848    Visit Number  7    Number of Visits  12    Date for PT Re-Evaluation  04/05/19    Authorization Type  [facility]    PT Start Time  0843    PT Stop Time  0928    PT Time Calculation (min)  45 min    Activity Tolerance  Patient tolerated treatment well    Behavior During Therapy  [facility] for tasks assessed/performed       Past Medical History:  Diagnosis Date  . Headache   . History of acute cystitis   . Recurrent upper respiratory infection (URI)     Past Surgical History:  Procedure Laterality Date  . NO PAST SURGERIES      There were no vitals filed for this visit.  Subjective Assessment - 03/22/19 0843    Subjective  "no pain today, just some stiffness"    Currently in Pain?  Yes    Pain Score  0-No pain    Pain Orientation  Right    Pain Type  Chronic pain    Pain Onset  More than a month ago    Pain Frequency  Intermittent                       [facility] Adult PT Treatment/Exercise - 03/22/19 0001      Exercises   Exercises  Knee/Hip      Knee/Hip Exercises: Stretches   Gastroc Stretch  2 reps;30 seconds;Both   slant board     Knee/Hip Exercises: Aerobic   Elliptical  L 5 x 6 min, elevation  L1      Knee/Hip Exercises: Machines for Strengthening   Hip Cybex  2 x going to fatigue 25# bil hip abduction      Knee/Hip Exercises: Standing   Heel Raises  2 sets   going to fatigue with inversion     Knee/Hip Exercises: Sidelying   Other Sidelying Knee/Hip Exercises  R sidelying R hip ER 2 x going to fatigue with       Iontophoresis   Type of Iontophoresis  Dexamethasone    Location   around the head of the fifth metatarsal    Dose  1cc    Time  6 hour patch          Balance Exercises - 03/22/19 0914      Balance Exercises: Standing   SLS  Eyes open;2 reps   ankle ABC with black physioball       PT Education - 03/22/19 0931    Education Details  updated HEP    Person(s) Educated  Patient    Methods  Explanation;Verbal cues    Comprehension  Verbalized understanding;Verbal cues required       PT Short Term Goals - 03/15/19 1006      PT SHORT TERM GOAL #1   Title  Patient will increase bilateral active ankle DF by 5 degrres    Time  3    Period  Weeks    Status  On-going      PT SHORT TERM GOAL #2   Title  Patient  will increase gross bilatewral ankle strength to 5/5    Time  3    Period  Weeks    Status  On-going    Target Date  03/15/19      PT SHORT TERM GOAL #3   Title  Patient will report 3/10 pain at worst with ambualtion    Time  3    Period  Weeks    Status  On-going        PT Long Term Goals - 02/22/19 1313      PT LONG TERM GOAL #1   Title  Patient will stand at work for 2 hours without increased pain    Time  6    Period  Weeks    Status  New    Target Date  04/05/19      PT LONG TERM GOAL #2   Title  Patient will walk 1 mile without increased pain in order to go shopping    Time  6    Period  Weeks    Status  New    Target Date  04/05/19      PT LONG TERM GOAL #3   Title  Patient will be independent with program to decrease pain and to continue to improve strength    Time  6    Period  Weeks    Status  New    Target Date  04/05/19            Plan - 03/22/19 0931    Clinical Impression Statement  pt continues to progress with physical therpay and reports no pain today just mostly stiffness in the calves. focused on strengthening of the ankle through hip to promote proximal/ distal kinetic chain stability/ support. she did fatigue quickly with SLS activity but noted no pain end of session.    PT Next Visit  Plan  continue with manual therapy. Consider taping next visit. Continue with ionto if beneifcial. update HEP to adressing any questions or deficits    PT Home Exercise Plan  great toe mobilization; Plantar facia TFM; toe scruntch    Consulted and Agree with Plan of Care  Patient       Patient will benefit from skilled therapeutic intervention in order to improve the following deficits and impairments:  Abnormal gait, Decreased range of motion, Decreased activity tolerance, Decreased mobility, Decreased strength, Decreased knowledge of precautions, Decreased safety awareness, Difficulty walking  Visit Diagnosis: Pain in right ankle and joints of right foot  Pain in left ankle and joints of left foot  Other abnormalities of gait and mobility     Problem List Patient Active Problem List   Diagnosis Date Noted  . Pregnant 09/04/2017   Starr Lake PT, DPT, LAT, ATC  03/22/19  9:42 AM      [place] [facility] [address] [place], [place], [address] Phone: [phone]   Fax:  [phone]  Name: PANAYIOTA BRANDNER MRN: [id number] Date of Birth: 1990-08-13

## 2019-03-25 ENCOUNTER — Encounter: Payer: Self-pay | Admitting: Physical Therapy

## 2019-03-25 ENCOUNTER — Ambulatory Visit: Payer: 59 | Admitting: Physical Therapy

## 2019-03-25 ENCOUNTER — Other Ambulatory Visit: Payer: Self-pay

## 2019-03-25 DIAGNOSIS — R2689 Other abnormalities of gait and mobility: Secondary | ICD-10-CM | POA: Diagnosis not present

## 2019-03-25 DIAGNOSIS — M25572 Pain in left ankle and joints of left foot: Secondary | ICD-10-CM

## 2019-03-25 DIAGNOSIS — M25571 Pain in right ankle and joints of right foot: Secondary | ICD-10-CM

## 2019-03-25 NOTE — Therapy (Signed)
[place], [place], [address] Phone: [phone]   Fax:  [phone]  Physical Therapy Treatment / Discharge  Patient Details  Name: Chelsey Roberts MRN: [id number] Date of Birth: Oct 02, 1990 Referring Provider (PT): Dr Eunice Blase   Encounter Date: 03/25/2019  PT End of Session - 03/25/19 1631    Visit Number  8    Number of Visits  12    Date for PT Re-Evaluation  04/05/19    Authorization Type  [facility] [employer]    PT Start Time  1630    PT Stop Time  1708    PT Time Calculation (min)  38 min    Activity Tolerance  Patient tolerated treatment well    Behavior During Therapy  [facility] [place] [facility] for tasks assessed/performed       Past Medical History:  Diagnosis Date  . Headache   . History of acute cystitis   . Recurrent upper respiratory infection (URI)     Past Surgical History:  Procedure Laterality Date  . NO PAST SURGERIES      There were no vitals filed for this visit.  Subjective Assessment - 03/25/19 1635    Subjective  " some soreness from the new exercises but overall I am doing pretty good"    Currently in Pain?  No/denies    Pain Score  0-No pain    Pain Frequency  Occasional    Aggravating Factors   prolonged standing/ walking         [facility] PT Assessment - 03/25/19 0001      Assessment   Medical Diagnosis  Bilateral plantar facititis     Referring Provider (PT)  Dr Legrand Como Hilts      Observation/Other Assessments   Focus on Therapeutic Outcomes (FOTO)   27% limited      AROM   Right Ankle Dorsiflexion  12    Left Ankle Dorsiflexion  14      Strength   Right Ankle Dorsiflexion  5/5    Right Ankle Plantar Flexion  5/5    Right Ankle Inversion  5/5    Right Ankle Eversion  5/5   pain along the head of the fifth metatarsal   Left Ankle Dorsiflexion  5/5    Left Ankle Plantar Flexion  5/5    Left Ankle Inversion  5/5    Left Ankle Eversion  5/5                   [facility]  Adult PT Treatment/Exercise - 03/25/19 0001      Knee/Hip Exercises: Standing   Hip Abduction  2 sets;10 reps;Knee straight    Hip Extension  2 sets;10 reps    Gait Training  4 x 20 ft walking heel strike/toe off with emphasis on pushing through the big toe to avoid excessive pressure along the fifth  metatarsal      Knee/Hip Exercises: Sidelying   Other Sidelying Knee/Hip Exercises  L/R sidelying hip ER 1 x 10 ea.      Ankle Exercises: Standing   Other Standing Ankle Exercises  PF 1 x 10 with blue band             PT Education - 03/25/19 1715    Education Details  reviewed and updated HEP. Discussed how to progress strengthening/ endurance with increaesd reps/ sets and resistance.    Person(s) Educated  Patient    Methods  Explanation;Verbal cues;Handout    Comprehension  Verbalized  understanding;Verbal cues required       PT Short Term Goals - 03/25/19 1640      PT SHORT TERM GOAL #1   Title  Patient will increase bilateral active ankle DF by 5 degrres    Period  Weeks    Status  Achieved      PT SHORT TERM GOAL #2   Title  Patient will increase gross bilatewral ankle strength to 5/5    Period  Weeks    Status  Achieved      PT SHORT TERM GOAL #3   Title  Patient will report 3/10 pain at worst with ambualtion    Period  Weeks    Status  Achieved        PT Long Term Goals - 03/25/19 1645      PT LONG TERM GOAL #1   Title  Patient will stand at work for 2 hours without increased pain    Period  Weeks    Status  Achieved      PT LONG TERM GOAL #2   Title  Patient will walk 1 mile without increased pain in order to go shopping    Period  Weeks    Status  Achieved      PT LONG TERM GOAL #3   Title  Patient will be independent with program to decrease pain and to continue to improve strength    Time  6    Period  Weeks    Status  Achieved            Plan - 03/25/19 1713    Clinical Impression Statement  Chelsey Roberts has made great progress with physical  therapy increasing ankle ROM, and strength and additionally reports no pain. She met all goals to day and was able to do all exercises with no issues or limitations. She is able to maintain and progress current level of function independently and will be discharged from PT today.    PT Treatment/Interventions  ADLs/Self Care Home Management;Cryotherapy;Electrical Stimulation;Iontophoresis 42m/ml Dexamethasone;Moist Heat;Ultrasound;Gait training;Stair training;Functional mobility training;Therapeutic activities;Therapeutic exercise;Patient/family education;Manual techniques;Passive range of motion;Dry needling;Taping    PT Next Visit Plan  DC    PT Home Exercise Plan  great toe mobilization; Plantar facia TFM; toe scruntch    Consulted and Agree with Plan of Care  Patient       Patient will benefit from skilled therapeutic intervention in order to improve the following deficits and impairments:  Abnormal gait, Decreased range of motion, Decreased activity tolerance, Decreased mobility, Decreased strength, Decreased knowledge of precautions, Decreased safety awareness, Difficulty walking  Visit Diagnosis: Pain in right ankle and joints of right foot  Pain in left ankle and joints of left foot  Other abnormalities of gait and mobility     Problem List Patient Active Problem List   Diagnosis Date Noted  . Pregnant 09/04/2017   KStarr LakePT, DPT, LAT, ATC  03/25/19  5:19 PM      [facility] [address]Phone: [phone]  Fax:  [phone] Name: Chelsey MATTEOMRN: [id number] of Birth: 9Nov 19, 1992     PHYSICAL THERAPY DISCHARGE SUMMARY  Visits from Start of Care: 8  Current functional level related to goals / functional outcomes: See goals, FOTO 27% limited   Remaining deficits: Mild soreness along the fifth metatarsal on the R with direct pressure only.    Education /  Equipment: HEP, theraband, posture,  Plan: Patient agrees to discharge.  Patient goals were met. Patient is being discharged due to being pleased with the current functional level.  ?????          Prestina Raigoza PT, DPT, LAT, ATC  03/25/19  5:21 PM

## 2019-04-05 DIAGNOSIS — N649 Disorder of breast, unspecified: Secondary | ICD-10-CM | POA: Diagnosis not present

## 2019-04-05 DIAGNOSIS — Z01419 Encounter for gynecological examination (general) (routine) without abnormal findings: Secondary | ICD-10-CM | POA: Diagnosis not present

## 2019-04-05 DIAGNOSIS — Z309 Encounter for contraceptive management, unspecified: Secondary | ICD-10-CM | POA: Diagnosis not present

## 2019-04-05 DIAGNOSIS — Z6832 Body mass index (BMI) 32.0-32.9, adult: Secondary | ICD-10-CM | POA: Diagnosis not present

## 2019-04-05 DIAGNOSIS — F419 Anxiety disorder, unspecified: Secondary | ICD-10-CM | POA: Insufficient documentation

## 2019-04-15 ENCOUNTER — Telehealth: Payer: Self-pay

## 2019-04-15 MED ORDER — AZITHROMYCIN 250 MG PO TABS: ORAL_TABLET | ORAL | 0 refills | Status: DC

## 2019-04-15 NOTE — Telephone Encounter (Signed)
I advised the patient

## 2019-04-15 NOTE — Telephone Encounter (Signed)
Sent to [facility].

## 2019-04-15 NOTE — Addendum Note (Signed)
Addended by: Hortencia Pilar on: 04/15/2019 12:11 PM   Modules accepted: Orders

## 2019-04-15 NOTE — Telephone Encounter (Signed)
Patient complains of fullness in her sinuses/ears. No ear pain. Blows yellowish/bloody discharge from nose first a.m. No fever. No cough/chest congestion. Taking allergy medication/nasal spray/saline irrigation. Asks if she can get a zpak before it gets worse. This is the same as last year at this time - "I let it go too long last year."

## 2019-06-02 DIAGNOSIS — Z20828 Contact with and (suspected) exposure to other viral communicable diseases: Secondary | ICD-10-CM | POA: Diagnosis not present

## 2019-06-02 DIAGNOSIS — R519 Headache, unspecified: Secondary | ICD-10-CM | POA: Diagnosis not present

## 2019-08-02 ENCOUNTER — Other Ambulatory Visit: Payer: Self-pay | Admitting: Family Medicine

## 2019-08-02 MED ORDER — AZITHROMYCIN 250 MG PO TABS: ORAL_TABLET | ORAL | 0 refills | Status: DC

## 2019-08-02 NOTE — Telephone Encounter (Signed)
Ok to fill 

## 2019-08-03 ENCOUNTER — Other Ambulatory Visit: Payer: Self-pay | Admitting: Physician Assistant

## 2019-08-03 DIAGNOSIS — B9789 Other viral agents as the cause of diseases classified elsewhere: Secondary | ICD-10-CM

## 2019-08-10 IMAGING — US ULTRASOUND RIGHT BREAST LIMITED
1 series · 5 of 5 positions shown · non-contrast
Comparison: None.

CLINICAL DATA: 27-year-old with a skin lesion involving the OUTER
RIGHT breast adjacent to the nipple-areola complex that she states
has been there for many years but which changed color when she was
recently pregnant. The color changes involve the center of the
lesion becoming pink and red with the outer portion of the lesion
remaining a light brown color. The lesion is painless and there is
no underlying palpable concern.

EXAM:
ULTRASOUND OF THE RIGHT BREAST

[Series 1: ultrasound right breast limited · 0.06mm/px · 5 of 5 slices shown]
[im 1/5]
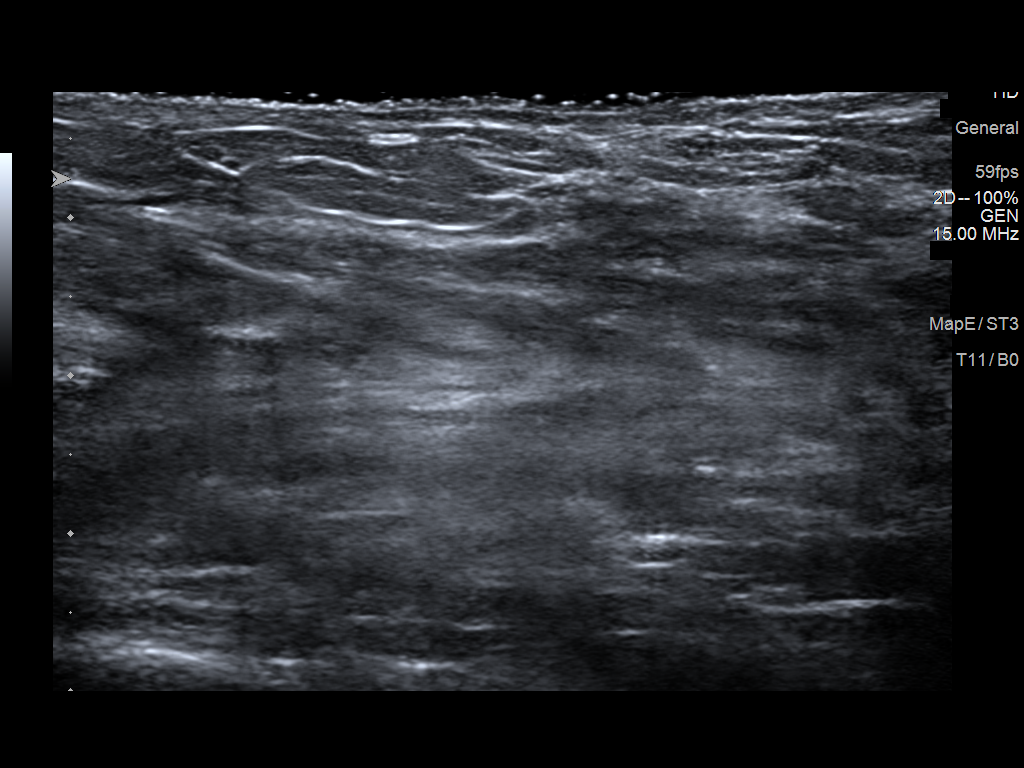
[im 2/5]
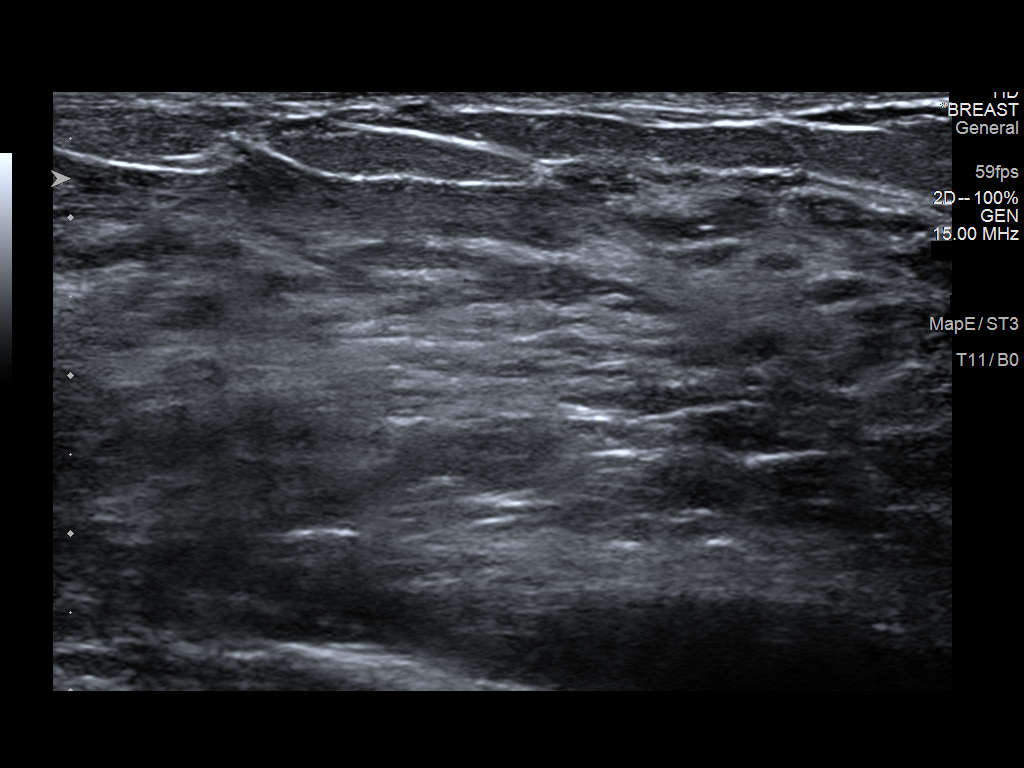
[im 3/5]
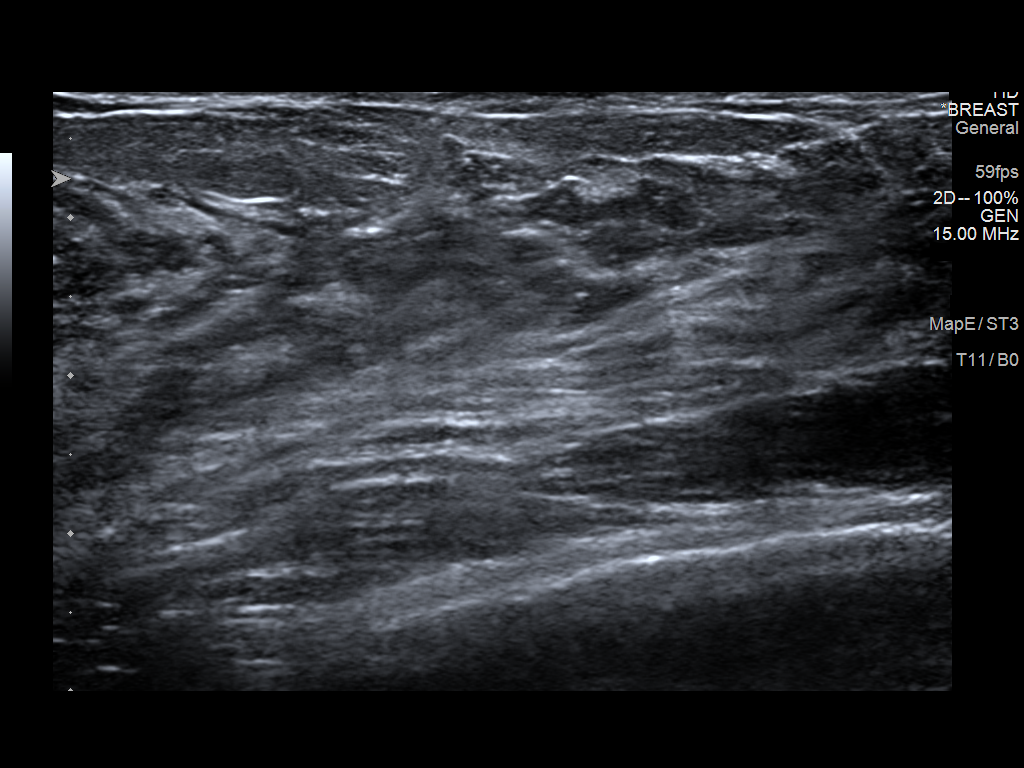
[im 4/5]
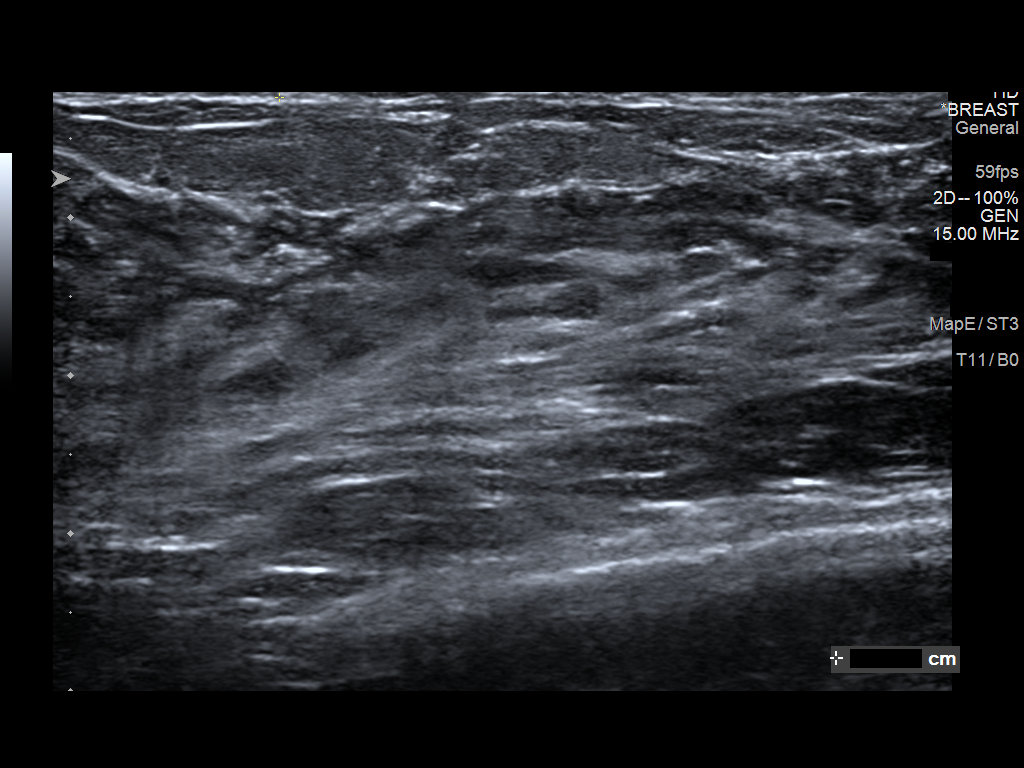
[im 5/5]
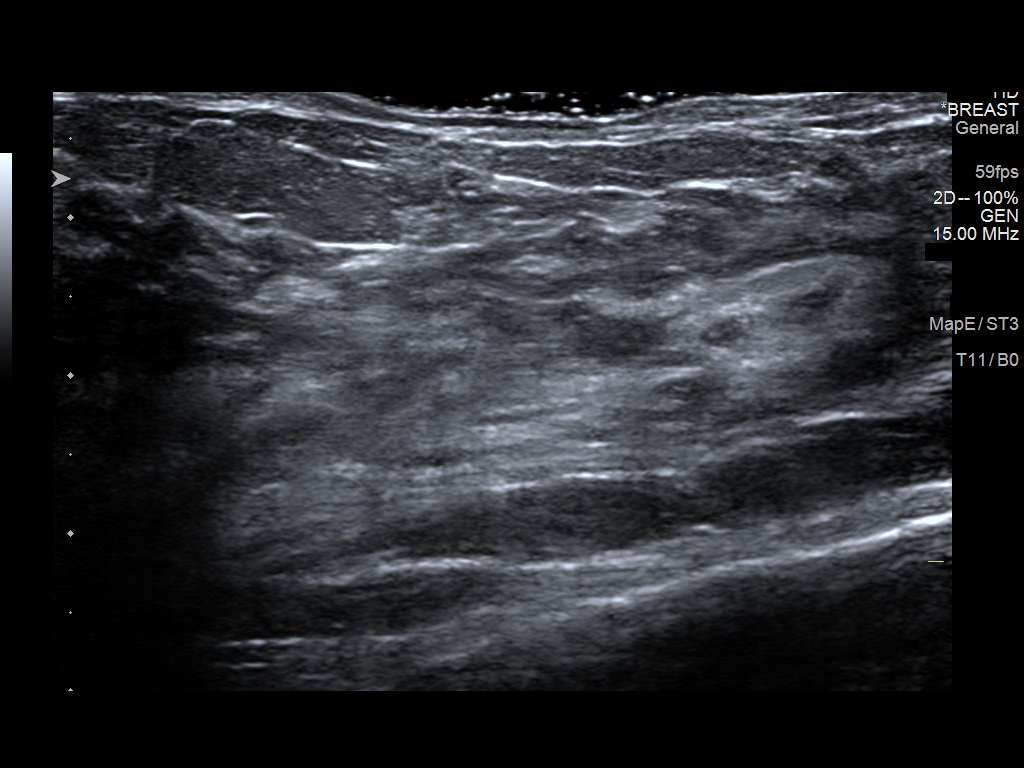

[5 of 5 positions shown; findings below may reference images not displayed]

FINDINGS: On physical exam, there is an approximate 3 cm lesion involving the
skin at the approximate 8 o'clock position approximately 3 cm from
the nipple which has a light brown periphery and a pink center. The
lesion is smooth and is not raised. There is no palpable abnormality
underlying the skin lesion.

Targeted RIGHT breast ultrasound is performed, showing that the skin
lesion is thinner than the normal dermal layer on either side of the
lesion. The underlying breast tissues are normal in appearance.
IMPRESSION: 1. No sonographic evidence of malignancy involving the RIGHT breast.
2. Pigmented skin lesion involving the LOWER OUTER periareolar RIGHT
breast which is thinner than the surrounding normal dermal layer of
the skin.

RECOMMENDATION:
1. Screening mammogram at age 39 unless there are persistent or
intervening clinical concerns. (Code:[id number])
2. The patient states that she has an appointment with dermatology
to further evaluate the skin lesion.

I have discussed the findings and recommendations with the patient.
Results were also provided in writing at the conclusion of the
visit.

BI-RADS CATEGORY  1: Negative.

## 2019-08-30 ENCOUNTER — Other Ambulatory Visit: Payer: Self-pay | Admitting: *Deleted

## 2019-08-30 DIAGNOSIS — J329 Chronic sinusitis, unspecified: Secondary | ICD-10-CM

## 2019-08-30 DIAGNOSIS — B9789 Other viral agents as the cause of diseases classified elsewhere: Secondary | ICD-10-CM

## 2019-08-30 MED ORDER — AZELASTINE HCL 0.1 % NA SOLN: 2.0000 | Freq: Two times a day (BID) | NASAL | 0 refills | Status: DC

## 2019-08-30 MED ORDER — FLUTICASONE PROPIONATE 50 MCG/ACT NA SUSP
2.0000 | Freq: Every day | NASAL | 0 refills | Status: DC
Start: 2019-08-30 — End: 2019-09-27

## 2019-09-01 ENCOUNTER — Encounter: Payer: Self-pay | Admitting: Allergy

## 2019-09-01 ENCOUNTER — Other Ambulatory Visit: Payer: Self-pay

## 2019-09-01 ENCOUNTER — Ambulatory Visit: Payer: 59 | Admitting: Allergy

## 2019-09-01 VITALS — BP 122/86 | HR 94 | Temp 98.3°F | Resp 16

## 2019-09-01 DIAGNOSIS — J3089 Other allergic rhinitis: Secondary | ICD-10-CM | POA: Diagnosis not present

## 2019-09-01 DIAGNOSIS — H1013 Acute atopic conjunctivitis, bilateral: Secondary | ICD-10-CM

## 2019-09-01 DIAGNOSIS — J329 Chronic sinusitis, unspecified: Secondary | ICD-10-CM

## 2019-09-01 MED ORDER — AZELASTINE HCL 0.1 % NA SOLN: 2.0000 | Freq: Two times a day (BID) | NASAL | 5 refills | Status: DC

## 2019-09-01 MED FILL — AZELASTINE HCL 137 MCG SPRY: 0.1 | 25 days supply | Qty: 30 | Fill #0

## 2019-09-01 NOTE — Patient Instructions (Addendum)
Allergies   - continue avoidance measures for tree pollens, weed pollens, grass pollens, molds  - continue Zyrtec 10mg  daily.  Can also try Xyzal 5mg  or Allegra 180mg  if Zyrtec becomes less effective.    - for itchy/watery/red eyes Pataday 1 drop each eye daily as needed  - for nasal congestion continue use of Flonase 2 sprays each nostril daily as needed.  Use for 1-2 weeks at a time before stopping once symptoms.    - for nasal drainage/post-nasal drip use nasal antihistamine, Astelin 2 sprays each nostril twice a day as needed.    - allergen immunotherapy has been discussed previously.  Consider if medication management is ineffective.    Recurrent sinus infections  - will obtain immunocompetence screen at this time with continued sinus infections requiring antibiotic treatment  - will obtain CBC w diff, CMP, immunoglobulins (these are your antibodies that help fight infections) and vaccine titers  Follow-up 6 months or sooner if needed

## 2019-09-01 NOTE — Progress Notes (Signed)
Follow-up Note  RE: Chelsey Roberts MRN: [id number] DOB: 01/12/1991 Date of Office Visit: 09/01/2019   History of present illness: Chelsey Roberts is a 29 y.o. female presenting today for follow-up of allergic rhinitis with conjunctivitis and recurrent sinus infections.  She was last seen in the office on March 03, 2019 by myself.  She states she has been doing relatively well since her last visit.  She did state in October she was starting to have increase in congestion as well as a sore throat and noted that she was started to have some bloody nasal drainage which is how most of her sinus infections present.  She states she called in her PCP and got a Z-Pak and the symptoms resolved.  And at the end of February 2021 she states she was at the beach for about a week and did not have her medications and when she came back home from the beach she had developed symptoms of fever, increased congestion, body aches.  She tested negative for Covid but she was treated again with a Z-Pak for a sinus infection and was improved with the antibiotic. With her allergies she states that she does start to get more symptomatic around this time of the year.  She does take her Zyrtec daily.  She does have both Flonase and Astelin and she will primarily use the Flonase if normal and the Astelin if needed.  She will use her Pataday daily as needed for any itchy or watery eyes.  She does feel that this combination of medications does help to control her allergy symptoms at this time.  Review of systems: Review of Systems  Constitutional: Negative.   HENT: Negative.   Eyes: Negative.   Respiratory: Negative.   Cardiovascular: Negative.   Gastrointestinal: Negative.   Musculoskeletal: Negative.   Skin: Negative.   Neurological: Negative.     All other systems negative unless noted above in HPI  Past medical/social/surgical/family history have been reviewed and are unchanged unless specifically indicated  below.  No changes  Medication List: Current Outpatient Medications  Medication Sig Dispense Refill  . acetaminophen (TYLENOL) 500 MG tablet Take 1,000 mg by mouth every 6 (six) hours as needed for headache.    Marland Kitchen azelastine (ASTELIN) 0.1 % nasal spray Place 2 sprays into both nostrils 2 (two) times daily. Use in each nostril as directed 30 mL 5  . ESTARYLLA 0.25-35 MG-MCG tablet Take 1 tablet by mouth as directed.  1  . fluticasone (FLONASE) 50 MCG/ACT nasal spray Place 2 sprays into both nostrils daily. 16 g 0  . ibuprofen (ADVIL,MOTRIN) 600 MG tablet Take 1 tablet (600 mg total) by mouth every 6 (six) hours. 30 tablet 0  . nabumetone (RELAFEN) 750 MG tablet Take 1 tablet (750 mg total) by mouth 2 (two) times daily as needed. 60 tablet 6  . Olopatadine HCl 0.2 % SOLN Apply 1 drop to eye daily as needed. 1 Bottle 3  . Prenatal Vit-Fe Fumarate-FA (PRENATAL MULTIVITAMIN) TABS tablet Take 1 tablet by mouth daily at 12 noon.    Marland Kitchen azithromycin (ZITHROMAX Z-PAK) 250 MG tablet Take as directed. (Patient not taking: Reported on 09/01/2019) 6 each 0   No current facility-administered medications for this visit.     Known medication allergies: Allergies  Allergen Reactions  . Tamiflu [Oseltamivir Phosphate] Nausea And Vomiting     Physical examination: Blood pressure 122/86, pulse 94, temperature 98.3 F (36.8 C), temperature source Temporal, resp. rate 16, SpO2  97 %,  General: Alert, interactive, in no acute distress. HEENT: PERRLA, TMs pearly gray, turbinates minimally edematous without discharge, post-pharynx non erythematous. Neck: Supple without lymphadenopathy. Lungs: Clear to auscultation without wheezing, rhonchi or rales. {no increased work of breathing. CV: Normal S1, S2 without murmurs. Abdomen: Nondistended, nontender. Skin: Warm and dry, without lesions or rashes. Extremities:  No clubbing, cyanosis or edema. Neuro:   Grossly intact.  Diagnositics/Labs: None today   Assessment and plan: Allergic rhinitis with conjunctivitis  - continue avoidance measures for tree pollens, weed pollens, grass pollens, molds  - continue Zyrtec 10mg  daily.  Can also try Xyzal 5mg  or Allegra 180mg  if Zyrtec becomes less effective.    - for itchy/watery/red eyes Pataday 1 drop each eye daily as needed  - for nasal congestion continue use of Flonase 2 sprays each nostril daily as needed.  Use for 1-2 weeks at a time before stopping once symptoms.    - for nasal drainage/post-nasal drip use nasal antihistamine, Astelin 2 sprays each nostril twice a day as needed.    - allergen immunotherapy has been discussed previously.  Consider if medication management is ineffective.    Recurrent sinus infections  - will obtain immunocompetence screen at this time with continued sinus infections requiring antibiotic treatment  - will obtain CBC w diff, CMP, immunoglobulins (these are your antibodies that help fight infections) and vaccine titers  Follow-up 6 months or sooner if needed  I appreciate the opportunity to take part in Oumou's care. Please do not hesitate to contact me with questions.  Sincerely,   Prudy Feeler, MD Allergy/Immunology [facility] [place]

## 2019-09-09 LAB — CBC WITH DIFFERENTIAL
Basophils Absolute: 0 10*3/uL (ref 0.0–0.2)
Basos: 0 %
EOS (ABSOLUTE): 0.3 10*3/uL (ref 0.0–0.4)
Eos: 3 %
Hematocrit: 40.2 % (ref 34.0–46.6)
Hemoglobin: 13.5 g/dL (ref 11.1–15.9)
Immature Grans (Abs): 0 10*3/uL (ref 0.0–0.1)
Immature Granulocytes: 0 %
Lymphocytes Absolute: 2.3 10*3/uL (ref 0.7–3.1)
Lymphs: 22 %
MCH: 30.1 pg (ref 26.6–33.0)
MCHC: 33.6 g/dL (ref 31.5–35.7)
MCV: 90 fL (ref 79–97)
Monocytes Absolute: 0.6 10*3/uL (ref 0.1–0.9)
Monocytes: 5 %
Neutrophils Absolute: 7.4 10*3/uL — ABNORMAL HIGH (ref 1.4–7.0)
Neutrophils: 70 %
RBC: 4.49 x10E6/uL (ref 3.77–5.28)
RDW: 11.6 % — ABNORMAL LOW (ref 11.7–15.4)
WBC: 10.5 10*3/uL (ref 3.4–10.8)

## 2019-09-09 LAB — STREP PNEUMONIAE 23 SEROTYPES IGG
Pneumo Ab Type 1*: 0.2 ug/mL — ABNORMAL LOW (ref >1.3)
Pneumo Ab Type 12 (12F)*: 0.7 ug/mL — ABNORMAL LOW (ref >1.3)
Pneumo Ab Type 14*: <0.1 ug/mL — ABNORMAL LOW (ref >1.3)
Pneumo Ab Type 17 (17F)*: <0.1 ug/mL — ABNORMAL LOW (ref >1.3)
Pneumo Ab Type 19 (19F)*: 3.1 ug/mL (ref >1.3)
Pneumo Ab Type 2*: 0.9 ug/mL — ABNORMAL LOW (ref >1.3)
Pneumo Ab Type 20*: 0.3 ug/mL — ABNORMAL LOW (ref >1.3)
Pneumo Ab Type 22 (22F)*: <0.1 ug/mL — ABNORMAL LOW (ref >1.3)
Pneumo Ab Type 23 (23F)*: 2.1 ug/mL (ref >1.3)
Pneumo Ab Type 26 (6B)*: <0.1 ug/mL — ABNORMAL LOW (ref >1.3)
Pneumo Ab Type 3*: 2.4 ug/mL (ref >1.3)
Pneumo Ab Type 34 (10A)*: 2.7 ug/mL (ref >1.3)
Pneumo Ab Type 4*: 0.1 ug/mL — ABNORMAL LOW (ref >1.3)
Pneumo Ab Type 43 (11A)*: 0.5 ug/mL — ABNORMAL LOW (ref >1.3)
Pneumo Ab Type 5*: 0.1 ug/mL — ABNORMAL LOW (ref >1.3)
Pneumo Ab Type 51 (7F)*: 0.5 ug/mL — ABNORMAL LOW (ref >1.3)
Pneumo Ab Type 54 (15B)*: 2.7 ug/mL (ref >1.3)
Pneumo Ab Type 56 (18C)*: <0.1 ug/mL — ABNORMAL LOW (ref >1.3)
Pneumo Ab Type 57 (19A)*: 2.8 ug/mL (ref >1.3)
Pneumo Ab Type 68 (9V)*: <0.1 ug/mL — ABNORMAL LOW (ref >1.3)
Pneumo Ab Type 70 (33F)*: 0.4 ug/mL — ABNORMAL LOW (ref >1.3)
Pneumo Ab Type 8*: <0.1 ug/mL — ABNORMAL LOW (ref >1.3)
Pneumo Ab Type 9 (9N)*: <0.1 ug/mL — ABNORMAL LOW (ref >1.3)

## 2019-09-09 LAB — COMPREHENSIVE METABOLIC PANEL
ALT: 10 IU/L (ref 0–32)
AST: 18 IU/L (ref 0–40)
Albumin/Globulin Ratio: 1.9 (ref 1.2–2.2)
Albumin: 4.7 g/dL (ref 3.9–5.0)
Alkaline Phosphatase: 55 IU/L (ref 39–117)
BUN/Creatinine Ratio: 15 (ref 9–23)
BUN: 13 mg/dL (ref 6–20)
Bilirubin Total: 0.3 mg/dL (ref 0.0–1.2)
CO2: 24 mmol/L (ref 20–29)
Calcium: 9.4 mg/dL (ref 8.7–10.2)
Chloride: 100 mmol/L (ref 96–106)
Creatinine, Ser: 0.84 mg/dL (ref 0.57–1.00)
GFR calc Af Amer: 109 mL/min/{1.73_m2} (ref >59)
GFR calc non Af Amer: 94 mL/min/{1.73_m2} (ref >59)
Globulin, Total: 2.5 g/dL (ref 1.5–4.5)
Glucose: 87 mg/dL (ref 65–99)
Potassium: 4.4 mmol/L (ref 3.5–5.2)
Sodium: 139 mmol/L (ref 134–144)
Total Protein: 7.2 g/dL (ref 6.0–8.5)

## 2019-09-09 LAB — COMPLEMENT, TOTAL: Compl, Total (CH50): >60 U/mL (ref >41)

## 2019-09-09 LAB — DIPHTHERIA / TETANUS ANTIBODY PANEL
Diphtheria Ab: 0.73 IU/mL (ref <0.10)
Tetanus Ab, IgG: 2.24 IU/mL (ref <0.10)

## 2019-09-09 LAB — IMMUNOGLOBULINS A/E/G/M, SERUM
IgA/Immunoglobulin A, Serum: 165 mg/dL (ref 87–352)
IgE (Immunoglobulin E), Serum: 54 IU/mL (ref 6–495)
IgG (Immunoglobin G), Serum: 1374 mg/dL (ref 586–1602)
IgM (Immunoglobulin M), Srm: 64 mg/dL (ref 26–217)

## 2019-09-27 ENCOUNTER — Other Ambulatory Visit: Payer: Self-pay

## 2019-09-27 DIAGNOSIS — B9789 Other viral agents as the cause of diseases classified elsewhere: Secondary | ICD-10-CM

## 2019-09-27 MED ORDER — FLUTICASONE PROPIONATE 50 MCG/ACT NA SUSP: 2.0000 | Freq: Every day | NASAL | 4 refills | Status: DC

## 2020-02-01 DIAGNOSIS — H5213 Myopia, bilateral: Secondary | ICD-10-CM | POA: Diagnosis not present

## 2020-03-03 ENCOUNTER — Ambulatory Visit: Payer: 59 | Admitting: Allergy

## 2020-03-23 ENCOUNTER — Ambulatory Visit: Payer: Self-pay

## 2020-03-23 ENCOUNTER — Ambulatory Visit (INDEPENDENT_AMBULATORY_CARE_PROVIDER_SITE_OTHER): Payer: 59 | Admitting: Surgery

## 2020-03-23 DIAGNOSIS — Q7649 Other congenital malformations of spine, not associated with scoliosis: Secondary | ICD-10-CM

## 2020-03-23 DIAGNOSIS — M545 Low back pain, unspecified: Secondary | ICD-10-CM

## 2020-03-23 MED ORDER — IBUPROFEN 800 MG PO TABS: 800.0000 mg | ORAL_TABLET | Freq: Three times a day (TID) | ORAL | 0 refills | Status: DC | PRN

## 2020-03-23 MED ORDER — METHOCARBAMOL 500 MG PO TABS: 500.0000 mg | ORAL_TABLET | Freq: Three times a day (TID) | ORAL | 0 refills | Status: DC | PRN

## 2020-03-23 NOTE — Progress Notes (Signed)
Office Visit Note   Patient: Chelsey Roberts           Date of Birth: 25-Jul-1990           MRN: [id number] Visit Date: 03/23/2020              Requested by: No referring provider defined for this encounter. PCP: Patient, No Pcp Per   Assessment & Plan: Visit Diagnoses:  1. Low back pain, unspecified back pain laterality, unspecified chronicity, unspecified whether sciatica present   2. Congenital fusion of lumbar spine     Plan: X-rays reviewed with patient today.  It appears that she may have a congenital fusion at L3-4.  This has been symptomatic over the years but has not necessarily required patient to have an MRI.  She has been managing her pain conservatively when she has flares.  I will also try conservative treatment.  Patient was given a prescription for Robaxin 500 mg p.o. every 8 hours as needed for spasms.  Also sent in ibuprofen 800 mg p.o. every 8 hours as needed with food.  She will do some gentle stretching exercises.  We discussed proper bending and lifting techniques.  I will reevaluate patient next week to see how she is doing.  We will make decision at that time as to whether or not further imaging studies are needed.  I will also get Dr. Louanne Skye to review her lumbar spine x-ray that was done today to get his thoughts.  Follow-Up Instructions: Return if symptoms worsen or fail to improve.   Orders:  Orders Placed This Encounter  Procedures  . XR Lumbar Spine Complete   Meds ordered this encounter  Medications  . ibuprofen (ADVIL) 800 MG tablet    Sig: Take 1 tablet (800 mg total) by mouth every 8 (eight) hours as needed. Must take with food    Dispense:  60 tablet    Refill:  0  . methocarbamol (ROBAXIN) 500 MG tablet    Sig: Take 1 tablet (500 mg total) by mouth every 8 (eight) hours as needed for muscle spasms.    Dispense:  50 tablet    Refill:  0      Procedures: No procedures performed   Clinical Data: No additional  findings.   Subjective: Chief Complaint  Patient presents with  . 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  Back pain has been off and on for several years but worse last couple weeks.  States that she can give her 8-year-old daughter has been having some pain in her low back and right buttock.  She has been trying to do some stretching.  No complaints of pain radiating down the legs. Review of Systems No current cardiac pulmonary GI GU issues  Objective: Vital Signs: There were no vitals taken for this visit.  Physical Exam Very pleasant female alert and oriented in no acute distress.  Some low back pain with lumbar flexion extension.  Positive right-sided lumbar paraspinal tenderness/spasm.  Mildly positive right straight leg raise.  Negative logroll bilateral hips.  No focal motor deficits. Ortho Exam  Specialty Comments:  No specialty comments available.  Imaging: No results found.   PMFS History: Patient Active Problem List   Diagnosis Date Noted  . Pregnant 09/04/2017   Past Medical History:  Diagnosis Date  . Headache   . History of acute cystitis   .  Recurrent upper respiratory infection (URI)     Family History  Problem Relation Age of Onset  . Esophageal cancer Maternal Grandfather   . Hypertension Paternal Grandmother   . Stroke Paternal Grandmother   . Hypertension Paternal Grandfather   . Diabetes Paternal Grandfather   . Heart attack Paternal Grandfather   . Allergic rhinitis Mother   . Heart murmur Mother   . Hypertension Father   . Angioedema Neg Hx   . Asthma Neg Hx   . Atopy Neg Hx   . Eczema Neg Hx   . Immunodeficiency Neg Hx   . Urticaria Neg Hx     Past Surgical History:  Procedure Laterality Date  . NO PAST SURGERIES     Social History   Occupational History  . Not on file  Tobacco Use  . Smoking status: Never Smoker  . Smokeless tobacco: Never Used   Vaping Use  . Vaping Use: Never used  Substance and Sexual Activity  . Alcohol use: No  . Drug use: No  . Sexual activity: Not on file

## 2020-04-09 ENCOUNTER — Other Ambulatory Visit: Payer: Self-pay | Admitting: Surgery

## 2020-04-19 ENCOUNTER — Other Ambulatory Visit: Payer: Self-pay | Admitting: Family Medicine

## 2020-04-19 MED ORDER — AZITHROMYCIN 250 MG PO TABS: ORAL_TABLET | ORAL | 0 refills | Status: DC

## 2020-05-08 ENCOUNTER — Other Ambulatory Visit: Payer: Self-pay | Admitting: Surgery

## 2020-05-08 NOTE — Telephone Encounter (Signed)
Please advise 

## 2020-05-09 NOTE — Telephone Encounter (Signed)
I will refill with 1 additional as well.  If needs more after that we will need to come back in for evaluation to consider getting further imaging with MRI to better evaluate lumbar spine.

## 2020-07-24 DIAGNOSIS — Z9189 Other specified personal risk factors, not elsewhere classified: Secondary | ICD-10-CM | POA: Insufficient documentation

## 2022-08-26 DIAGNOSIS — E162 Hypoglycemia, unspecified: Secondary | ICD-10-CM | POA: Insufficient documentation

## 2022-09-26 ENCOUNTER — Other Ambulatory Visit: Payer: Self-pay | Admitting: Physician Assistant

## 2022-09-26 DIAGNOSIS — R519 Headache, unspecified: Secondary | ICD-10-CM

## 2023-03-25 ENCOUNTER — Other Ambulatory Visit (HOSPITAL_COMMUNITY): Payer: Self-pay

## 2023-03-25 MED ORDER — RIZATRIPTAN BENZOATE 10 MG PO TABS
10.0000 mg | ORAL_TABLET | ORAL | 5 refills | Status: AC
  Filled 2023-03-25: qty 9, 30d supply, fill #0
  Filled 2023-07-16: qty 9, 30d supply, fill #1

## 2023-05-14 ENCOUNTER — Other Ambulatory Visit (HOSPITAL_COMMUNITY): Payer: Self-pay

## 2023-05-14 DIAGNOSIS — R110 Nausea: Secondary | ICD-10-CM | POA: Diagnosis not present

## 2023-05-14 DIAGNOSIS — F411 Generalized anxiety disorder: Secondary | ICD-10-CM | POA: Diagnosis not present

## 2023-05-14 DIAGNOSIS — F40298 Other specified phobia: Secondary | ICD-10-CM | POA: Diagnosis not present

## 2023-05-14 DIAGNOSIS — G479 Sleep disorder, unspecified: Secondary | ICD-10-CM | POA: Diagnosis not present

## 2023-05-14 DIAGNOSIS — G43009 Migraine without aura, not intractable, without status migrainosus: Secondary | ICD-10-CM | POA: Diagnosis not present

## 2023-05-14 MED ORDER — TRAZODONE HCL 50 MG PO TABS
25.0000 mg | ORAL_TABLET | Freq: Every evening | ORAL | 2 refills | Status: DC
  Filled 2023-05-14: qty 30, 30d supply, fill #0

## 2023-05-14 MED ORDER — HYDROXYZINE HCL 10 MG PO TABS
10.0000 mg | ORAL_TABLET | Freq: Three times a day (TID) | ORAL | 0 refills | Status: AC | PRN
  Filled 2023-05-14: qty 30, 10d supply, fill #0

## 2023-05-14 NOTE — Progress Notes (Signed)
 Today the history is gathered from: 100% - patient  0% - alone today  RECORDS SUMMARY: Patient here today for evaluation of migraines.  REFERRING PHYSICIAN: Pcp PRIMARY CARE PHYSICIAN:  Orlando Arabia   IMPRESSION/PLAN  Chelsey Roberts is a 32 y.o. female presenting for evaluation of  MIGRAINE/ DIFFICULTY SLEEPING/ ANXIETY - Ongoing. - Patient with no major PMH, with improving migraines.  Worsening sleep difficulty.  Ongoing anxiety, occasional panic attacks. - Symptoms consistent with episodic migraine without aura. - Start hydroxyzine  10mg  1-3 times a day as needed for panic attacks. - Continue zoloft 100mg  nightly.  - Start trazodone  25-50mg  nightly for sleep difficulty - Continue sleep hygiene practices. - Continue Maxalt  as needed - Continue Zofran  as needed   Follow-up in 2 to 3 months  Medications previously tried:   CHIEF COMPLAINT & HPI  Chelsey Roberts is a 32 y.o. female presenting for evaluation of: Chief Complaint  Patient presents with  . MIGRAINE/ DIFFICULTY SLEEPING/ ANXIETY    MIGRAINE/ DIFFICULTY SLEEPING/ ANXIETY Patient with migraines, improved. Occurring about once a month.  They improved with Maxalt .  Has not needed Zofran  recently.  Patient reports ongoing sleep difficulty.  States that she will go to bed around 9:00 and wake up at 11, 1, 230, 4 and 430.  Often has difficulty falling back asleep.  Does feel that mind is racing when she wakes up.  Does feel that Zoloft 100 mg has been helpful for anxiety.  Not currently in therapy but has good support system.  Occasionally having panic attacks. Interested in prn medication for this. Endorses good sleep hygiene.  She drinks about 1 cup of coffee a day.  Not napping throughout the day.  Not eating large meals, consuming less of sugar before bed.  Notes that small glass of wine can sometimes help with sleep.  DATA SUMMARY:  09/25/2022 MRI BRAIN WO CONTRAST  VISIT SUMMARIES:   MEDICATIONS Current  Outpatient Medications  Medication Sig Dispense Refill  . cetirizine (ZYRTEC) 10 MG tablet Take 10 mg by mouth once daily    . norgestimate-ethinyl estradioL (SPRINTEC 0.25/35, 28,) 0.25-35 mg-mcg tablet Take 1 tablet by mouth once daily    . ondansetron  (ZOFRAN ) 4 MG tablet Take 4 mg by mouth every 8 (eight) hours as needed for Nausea    . rizatriptan  (MAXALT ) 10 MG tablet Take 1 tablet (10 mg total) by mouth as directed for Migraine May take a second dose after 2 hours if needed. 9 tablet 5  . sertraline (ZOLOFT) 50 MG tablet Take 100 mg by mouth once daily     No current facility-administered medications for this visit.    ALLERGIES Allergies  Allergen Reactions  . Oseltamivir Hives and Nausea     EXAM   Vitals:   05/14/23 1512  BP: (!) 127/90  Pulse: 64  Weight: 88 kg (194 lb)  Height: 157.5 cm (5' 2)  PainSc: 0-No pain    Body mass index is 35.48 kg/m.  GENERAL: Pleasant female, no acute distress. Normocephalic and atraumatic.  Baseline neurological exam below was obtained at prior office visit. Changes from today's visit appear in bold.    EYES: PERRLA EOM's intact  MUSCULOSKELETAL: Bulk - Normal Tone - Normal Pronator Drift - Absent bilaterally. Ambulation - Gait and station is steady  Romberg - absent  R/L 5/5    Shoulder abduction (deltoid/supraspinatus, axillary/suprascapular n, C5) 5/5    Elbow flexion (biceps brachii, musculoskeletal n, C5-6) 5/5    Elbow extension (triceps, radial  n, C7) 5/5    Finger adduction (interossei, ulnar n, T1)  5/5    Hip flexion (iliopsoas, L1/L2) 5/5    Knee flexion (hamstrings, sciatic n, L5/S1)  5/5    Knee extension (quadriceps, femoral n, L3/4) 5/5    Ankle dorsiflexion (tibialis anterior, deep fibular n, L4/5) 5/5    Ankle plantarflexion (gastroc, tibial n, S1)   NEUROLOGICAL: MENTAL STATUS: Patient is oriented to person, place and time.   Short-term memory is intact Long-term memory is intact.    Attention span and concentration are intact.   Naming and repetition are intact. Comprehension is intact.   Expressive speech is intact.   Patient's fund of knowledge is within normal limits for educational level.  CRANIAL NERVES: Visual acuity and visual fields are intact         Extraocular muscles are intact                        Facial sensation is intact bilaterally                Facial strength is intact bilaterally                   Hearing is intact bilaterally                              Palate elevates midline, normal phonation     Shoulder shrug strength is intact                    Tongue protrudes midline                       SENSATION: Pain and temperature (spinothalamic tracts) is normal. Position and vibration (dorsal columns) is normal.  COORDINATION/CEREBELLAR: Finger to nose testing is intact.  REFLEXES: Negative Hoffman's sign bilaterally.   PAST MEDICAL HISTORY No past medical history on file.  PAST SURGICAL HISTORY History reviewed. No pertinent surgical history.  FAMILY HISTORY No family history on file.  SOCIAL HISTORY  Social History   Tobacco Use  . Smoking status: Never  . Smokeless tobacco: Never  Substance Use Topics  . Alcohol use: Not Currently    Alcohol/week: 1.0 standard drink of alcohol    Types: 1 Glasses of wine per week    Comment: occasional  . Drug use: Never     REVIEW OF SYSTEMS:  13 system ROS was verbally reviewed with patient. Pertinent positives and negatives are mentioned above in the HPI and all other systems are negative.   DATA   No visits with results within 6 Month(s) from this visit.  Latest known visit with results is:  No results found for any previous visit.    No follow-ups on file.  Payor: AETNA / Plan: [employer] HD[employer] SAVE PLAN / Product Type: EPO /    This note is partially written by Roe Mater, in the presence of and acting as the scribe of Lauraine Rocks, PA-C.    I agree that  the scribe documentation is complete and accurate.  This note was generated in part with voice recognition software and I apologize for any typographical errors that were not detected and corrected.     Attestation Statement:   I personally performed the service, non-incident to. (WP)   SARAH ALMARIE ROCKS, PA

## 2023-07-16 ENCOUNTER — Other Ambulatory Visit (HOSPITAL_BASED_OUTPATIENT_CLINIC_OR_DEPARTMENT_OTHER): Payer: Self-pay

## 2023-08-25 NOTE — Progress Notes (Unsigned)
 New Patient Office Visit  Subjective    Patient ID: Chelsey Roberts, female    DOB: May 30, 1991  Age: 33 y.o. MRN: [id number]  CC: No chief complaint on file.   HPI Chelsey Roberts presents to establish care. Prior PCP was *** at ***. Last physical was ***. she notes that she requires refills of ***.  *** Migraines (episodic without aura)- follows with Dr. Malvin Johns and Nilda Calamity, PA with neurology at [facility]. Once monthly migraines controlled on Maxalt.  Difficulty Sleeping- Currently utilizing trazodone 25-50mg  nightly.  Anxiety- Patient was provided with hydroxyzine for prn use for panic attacks. Currenlty on Zoloft 100mg .  Screenings:  Colon Cancer: *** Lung Cancer: *** Breast Cancer: *** Cervical Cancer:  Diabetes: *** Ophthalmology*** Foot*** HLD: ***  The ASCVD Risk score (Arnett DK, et al., 2019) failed to calculate for the following reasons:   The 2019 ASCVD risk score is only valid for ages 54 to 70 Hep B Vax: ***  Acute Problems: ***  Outpatient Encounter Medications as of 08/26/2023  Medication Sig   acetaminophen (TYLENOL) 500 MG tablet Take 1,000 mg by mouth every 6 (six) hours as needed for headache.   azelastine (ASTELIN) 0.1 % nasal spray Place 2 sprays into both nostrils 2 (two) times daily. Use in each nostril as directed   azithromycin (ZITHROMAX Z-PAK) 250 MG tablet Take as directed.   ESTARYLLA 0.25-35 MG-MCG tablet Take 1 tablet by mouth as directed.   fluticasone (FLONASE) 50 MCG/ACT nasal spray Place 2 sprays into both nostrils daily.   hydrOXYzine (ATARAX) 10 MG tablet Take 1 tablet (10 mg total) by mouth 1-3 times daily as needed for panic attacks   ibuprofen (ADVIL) 800 MG tablet TAKE 1 TABLET(800 MG) BY MOUTH EVERY 12 HOURS WITH FOOD AS NEEDED FOR MODERATE PAIN   ibuprofen (ADVIL,MOTRIN) 600 MG tablet Take 1 tablet (600 mg total) by mouth every 6 (six) hours.   methocarbamol (ROBAXIN) 500 MG tablet Take 1 tablet (500 mg total) by mouth  every 8 (eight) hours as needed for muscle spasms.   nabumetone (RELAFEN) 750 MG tablet Take 1 tablet (750 mg total) by mouth 2 (two) times daily as needed. (Patient not taking: Reported on 03/23/2020)   Olopatadine HCl 0.2 % SOLN Apply 1 drop to eye daily as needed.   Prenatal Vit-Fe Fumarate-FA (PRENATAL MULTIVITAMIN) TABS tablet Take 1 tablet by mouth daily at 12 noon.   rizatriptan (MAXALT) 10 MG tablet Take 1 tablet (10 mg total) by mouth as directed at onset of migraine, may take a second dose after 2 hours if needed.   traZODone (DESYREL) 50 MG tablet Take 1/2 tablet (25 mg total) by mouth nightly for 1 week, can increase to 1 tablet nightly (50 mg total) if needed   No facility-administered encounter medications on file as of 08/26/2023.    Past Medical History:  Diagnosis Date   Headache    History of acute cystitis    Recurrent upper respiratory infection (URI)     Past Surgical History:  Procedure Laterality Date   NO PAST SURGERIES      Family History  Problem Relation Age of Onset   Esophageal cancer Maternal Grandfather    Hypertension Paternal Grandmother    Stroke Paternal Grandmother    Hypertension Paternal Grandfather    Diabetes Paternal Grandfather    Heart attack Paternal Grandfather    Allergic rhinitis Mother    Heart murmur Mother    Hypertension Father  Angioedema Neg Hx    Asthma Neg Hx    Atopy Neg Hx    Eczema Neg Hx    Immunodeficiency Neg Hx    Urticaria Neg Hx     Social History   Socioeconomic History   Marital status: Married    Spouse name: Not on file   Number of children: Not on file   Years of education: Not on file   Highest education level: Not on file  Occupational History   Not on file  Tobacco Use   Smoking status: Never   Smokeless tobacco: Never  Vaping Use   Vaping status: Never Used  Substance and Sexual Activity   Alcohol use: No   Drug use: No   Sexual activity: Not on file  Other Topics Concern   Not on  file  Social History Narrative   Not on file   Social Drivers of Health   Financial Resource Strain: Not on file  Food Insecurity: Not on file  Transportation Needs: Not on file  Physical Activity: Not on file  Stress: Not on file  Social Connections: Not on file  Intimate Partner Violence: Not on file    ROS  Per HPI      Objective    There were no vitals taken for this visit.  Physical Exam  {Labs (Optional):23779}    Assessment & Plan:   There are no diagnoses linked to this encounter.  No follow-ups on file.   Teryl Lucy Daesia Zylka, PA-C

## 2023-08-26 ENCOUNTER — Encounter (HOSPITAL_BASED_OUTPATIENT_CLINIC_OR_DEPARTMENT_OTHER): Payer: Self-pay | Admitting: Student

## 2023-08-26 ENCOUNTER — Ambulatory Visit (INDEPENDENT_AMBULATORY_CARE_PROVIDER_SITE_OTHER): Payer: Commercial Managed Care - PPO | Admitting: Student

## 2023-08-26 VITALS — BP 136/91 | HR 72 | Temp 99.0°F | Ht 62.21 in | Wt 197.4 lb

## 2023-08-26 DIAGNOSIS — G479 Sleep disorder, unspecified: Secondary | ICD-10-CM | POA: Diagnosis not present

## 2023-08-26 DIAGNOSIS — G43009 Migraine without aura, not intractable, without status migrainosus: Secondary | ICD-10-CM | POA: Diagnosis not present

## 2023-08-26 DIAGNOSIS — Z7689 Persons encountering health services in other specified circumstances: Secondary | ICD-10-CM

## 2023-08-26 DIAGNOSIS — E161 Other hypoglycemia: Secondary | ICD-10-CM | POA: Diagnosis not present

## 2023-08-26 DIAGNOSIS — F419 Anxiety disorder, unspecified: Secondary | ICD-10-CM

## 2023-08-26 DIAGNOSIS — Z130 Encounter for screening for diseases of the blood and blood-forming organs and certain disorders involving the immune mechanism: Secondary | ICD-10-CM | POA: Diagnosis not present

## 2023-08-26 NOTE — Patient Instructions (Addendum)
 It was nice to see you today!  As we discussed in clinic please let me know if you have any issues before your next visit.  A type of therapy known as CBT-I is considered the first-line treatment for insomnia. It may even be able to help 4/5 individuals get better sleep. At this time I am unaware of many institutions offer CBT-I within our area.  There are some online therapy use which can be both effective for sleep and cost effective as well.  Go! To Sleep- Web-based App-this is a Energy manager therapy platform created by [facility] clinic for patients with insomnia.  Last time checked, the cost was around $40 (pretty cheap for better sleep!).  CBT-I Coach- iOS/Android App- This app provides education about sleep and CBT-I, personalized feedback about your sleep, and an option for sleep diary to track wake and sleep times.  Sleepio-  iOS/Android App and Web-based App- A fairly popular option, may be worth a try!  [facility]omnia- (text based)- this option doesn't provide with audio or with an app but Carmelia Roller has some helpful information for you!  If you have any problems before your next visit feel free to message me via MyChart (minor issues or questions) or call the office, otherwise you may reach out to schedule an office visit.  Thank you! Gerilyn Pilgrim Roshad Hack, PA-C

## 2023-08-26 NOTE — Assessment & Plan Note (Signed)
 Stable- continue current regimen of maxalt and lifestyle changes. Continue to follow with neurology as needed. I will take over the Maxalt prescription as discussed. Does not require refills at this time. No issues noted on Maxalt.

## 2023-08-26 NOTE — Assessment & Plan Note (Signed)
 Patient's father recently had a saddle PE. She is unsure if he has any coagulopathies but she would like to be tested for coagulopathies- I will oblige.

## 2023-08-26 NOTE — Assessment & Plan Note (Signed)
 Stable- continue lifestyle management and diet. This has been worked up in more detail in the past with prior PCP.

## 2023-08-26 NOTE — Assessment & Plan Note (Signed)
 Stable- In the past she has tried melatonin, magnesium, and other supplements which did not help. Has been on trazodone in the past but she did not like the way it made her feel- stopped trazodone. Doing much better with less stress- continue to avoid undue stress. Continue lifestyle changes and modification.

## 2023-08-26 NOTE — Assessment & Plan Note (Signed)
 Stable- continue current regimen of hydroxyzine as needed for panic attacks. Practice stress relief techniques. I will be taking over hydroxyzine- no issues noted on medication. No refill needed at this time.

## 2023-08-27 ENCOUNTER — Encounter (HOSPITAL_BASED_OUTPATIENT_CLINIC_OR_DEPARTMENT_OTHER): Payer: Self-pay

## 2023-09-01 LAB — CBC WITH DIFFERENTIAL/PLATELET
Basophils Absolute: 0 10*3/uL (ref 0.0–0.2)
Basos: 0 %
EOS (ABSOLUTE): 0.3 10*3/uL (ref 0.0–0.4)
Eos: 3 %
Hematocrit: 45.4 % (ref 34.0–46.6)
Hemoglobin: 15.1 g/dL (ref 11.1–15.9)
Immature Grans (Abs): 0 10*3/uL (ref 0.0–0.1)
Immature Granulocytes: 0 %
Lymphocytes Absolute: 2.3 10*3/uL (ref 0.7–3.1)
Lymphs: 20 %
MCH: 30.4 pg (ref 26.6–33.0)
MCHC: 33.3 g/dL (ref 31.5–35.7)
MCV: 91 fL (ref 79–97)
Monocytes Absolute: 0.4 10*3/uL (ref 0.1–0.9)
Monocytes: 4 %
Neutrophils Absolute: 8.3 10*3/uL — ABNORMAL HIGH (ref 1.4–7.0)
Neutrophils: 73 %
Platelets: 278 10*3/uL (ref 150–450)
RBC: 4.97 x10E6/uL (ref 3.77–5.28)
RDW: 11.5 % — ABNORMAL LOW (ref 11.7–15.4)
WBC: 11.4 10*3/uL — ABNORMAL HIGH (ref 3.4–10.8)

## 2023-09-01 LAB — FACTOR 5 LEIDEN

## 2023-09-01 LAB — PROTIME-INR
INR: 0.9 (ref 0.9–1.2)
Prothrombin Time: 10.3 s (ref 9.1–12.0)

## 2023-09-03 ENCOUNTER — Encounter (HOSPITAL_BASED_OUTPATIENT_CLINIC_OR_DEPARTMENT_OTHER): Payer: Self-pay | Admitting: Student

## 2023-09-04 ENCOUNTER — Encounter (HOSPITAL_BASED_OUTPATIENT_CLINIC_OR_DEPARTMENT_OTHER): Payer: Self-pay

## 2023-09-29 ENCOUNTER — Encounter (HOSPITAL_BASED_OUTPATIENT_CLINIC_OR_DEPARTMENT_OTHER): Payer: Self-pay | Admitting: Student

## 2023-09-30 ENCOUNTER — Other Ambulatory Visit (HOSPITAL_BASED_OUTPATIENT_CLINIC_OR_DEPARTMENT_OTHER): Payer: Self-pay | Admitting: Student

## 2023-09-30 ENCOUNTER — Other Ambulatory Visit (HOSPITAL_BASED_OUTPATIENT_CLINIC_OR_DEPARTMENT_OTHER): Payer: Self-pay

## 2023-09-30 DIAGNOSIS — J302 Other seasonal allergic rhinitis: Secondary | ICD-10-CM

## 2023-09-30 MED ORDER — AZELASTINE HCL 0.1 % NA SOLN
1.0000 | Freq: Two times a day (BID) | NASAL | 5 refills | Status: AC
  Filled 2023-09-30: qty 30, 50d supply, fill #0

## 2023-09-30 MED ORDER — LEVOCETIRIZINE DIHYDROCHLORIDE 5 MG PO TABS: 5.0000 mg | ORAL_TABLET | Freq: Every evening | ORAL | 6 refills | Status: DC

## 2023-09-30 MED ORDER — LEVOCETIRIZINE DIHYDROCHLORIDE 5 MG PO TABS
5.0000 mg | ORAL_TABLET | Freq: Every evening | ORAL | 6 refills | Status: DC
  Filled 2023-09-30: qty 30, 30d supply, fill #0

## 2023-09-30 MED ORDER — AZELASTINE HCL 0.1 % NA SOLN: 1.0000 | Freq: Two times a day (BID) | NASAL | 5 refills | Status: DC

## 2023-10-09 DIAGNOSIS — Z01419 Encounter for gynecological examination (general) (routine) without abnormal findings: Secondary | ICD-10-CM | POA: Diagnosis not present

## 2023-10-09 DIAGNOSIS — Z803 Family history of malignant neoplasm of breast: Secondary | ICD-10-CM | POA: Diagnosis not present

## 2023-10-09 DIAGNOSIS — Z309 Encounter for contraceptive management, unspecified: Secondary | ICD-10-CM | POA: Diagnosis not present

## 2023-10-09 DIAGNOSIS — Z8249 Family history of ischemic heart disease and other diseases of the circulatory system: Secondary | ICD-10-CM | POA: Diagnosis not present

## 2023-10-09 DIAGNOSIS — Z124 Encounter for screening for malignant neoplasm of cervix: Secondary | ICD-10-CM | POA: Diagnosis not present

## 2023-10-09 DIAGNOSIS — N649 Disorder of breast, unspecified: Secondary | ICD-10-CM | POA: Diagnosis not present

## 2023-10-09 DIAGNOSIS — Z1151 Encounter for screening for human papillomavirus (HPV): Secondary | ICD-10-CM | POA: Diagnosis not present

## 2023-10-09 DIAGNOSIS — Z6836 Body mass index (BMI) 36.0-36.9, adult: Secondary | ICD-10-CM | POA: Diagnosis not present

## 2023-10-09 LAB — HM PAP SMEAR: HM Pap smear: NEGATIVE

## 2023-11-28 ENCOUNTER — Ambulatory Visit (INDEPENDENT_AMBULATORY_CARE_PROVIDER_SITE_OTHER): Admitting: Student

## 2023-11-28 ENCOUNTER — Encounter (HOSPITAL_BASED_OUTPATIENT_CLINIC_OR_DEPARTMENT_OTHER): Payer: Self-pay

## 2023-11-28 ENCOUNTER — Encounter (HOSPITAL_BASED_OUTPATIENT_CLINIC_OR_DEPARTMENT_OTHER): Payer: Self-pay | Admitting: Student

## 2023-11-28 VITALS — BP 120/83 | HR 73 | Temp 98.4°F | Resp 16 | Ht 62.0 in | Wt 201.3 lb

## 2023-11-28 DIAGNOSIS — J302 Other seasonal allergic rhinitis: Secondary | ICD-10-CM | POA: Insufficient documentation

## 2023-11-28 DIAGNOSIS — Z13220 Encounter for screening for lipoid disorders: Secondary | ICD-10-CM | POA: Diagnosis not present

## 2023-11-28 DIAGNOSIS — Z1329 Encounter for screening for other suspected endocrine disorder: Secondary | ICD-10-CM | POA: Diagnosis not present

## 2023-11-28 DIAGNOSIS — Z131 Encounter for screening for diabetes mellitus: Secondary | ICD-10-CM

## 2023-11-28 DIAGNOSIS — Z136 Encounter for screening for cardiovascular disorders: Secondary | ICD-10-CM | POA: Diagnosis not present

## 2023-11-28 DIAGNOSIS — Z23 Encounter for immunization: Secondary | ICD-10-CM

## 2023-11-28 DIAGNOSIS — Z0000 Encounter for general adult medical examination without abnormal findings: Secondary | ICD-10-CM | POA: Diagnosis not present
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
## 2023-11-28 NOTE — Assessment & Plan Note (Signed)
 Routine physical examination. She is implementing lifestyle changes, including a low-carbohydrate diet and increased physical activity. Weight gain is noted, possibly due to cessation of medications and hormonal changes. Reports improved well-being and sleep quality. Regular dental and eye care are maintained. Recent Pap smear was normal. Discussed benefits of weightlifting at her age to enhance bone density and muscle mass, which can improve long-term health outcomes. Explained that initial weight loss may stall due to muscle gain, but metabolic rate will increase over time, aiding in weight loss. Sleep duration of 7-10 hours is beneficial for long-term health, reducing dementia risk. - Order lipid panel, A1c, CMP, and TSH - Encourage continuation of low-carbohydrate diet and regular exercise - Advise follow-up with eye doctor for new contacts - Continue regular dental check-ups - Schedule follow-up in one year unless issues arise

## 2023-11-28 NOTE — Assessment & Plan Note (Signed)
 Continue prn treatment with xyzal  and azelastine  nasal spray.

## 2023-11-28 NOTE — Patient Instructions (Addendum)
 It was nice to see you today!  If you have any problems before your next visit feel free to message me via MyChart (minor issues or questions) or call the office, otherwise you may reach out to schedule an office visit.  Thank you! Janaiya Beauchesne, PA-C  Health Maintenance, Female Adopting a healthy lifestyle and getting preventive care are important in promoting health and wellness. Ask your health care provider about: The right schedule for you to have regular tests and exams. Things you can do on your own to prevent diseases and keep yourself healthy. What should I know about diet, weight, and exercise? Eat a healthy diet  Eat a diet that includes plenty of vegetables, fruits, low-fat dairy products, and lean protein. Do not eat a lot of foods that are high in solid fats, added sugars, or sodium. Maintain a healthy weight Body mass index (BMI) is used to identify weight problems. It estimates body fat based on height and weight. Your health care provider can help determine your BMI and help you achieve or maintain a healthy weight. Get regular exercise Get regular exercise. This is one of the most important things you can do for your health. Most adults should: Exercise for at least 150 minutes each week. The exercise should increase your heart rate and make you sweat (moderate-intensity exercise). Do strengthening exercises at least twice a week. This is in addition to the moderate-intensity exercise. Spend less time sitting. Even light physical activity can be beneficial. Watch cholesterol and blood lipids Have your blood tested for lipids and cholesterol at 34 years of age, then have this test every 5 years. Have your cholesterol levels checked more often if: Your lipid or cholesterol levels are high. You are older than 34 years of age. You are at high risk for heart disease. What should I know about cancer screening? Depending on your health history and family history, you may need  to have cancer screening at various ages. This may include screening for: Breast cancer. Cervical cancer. Colorectal cancer. Skin cancer. Lung cancer. What should I know about heart disease, diabetes, and high blood pressure? Blood pressure and heart disease High blood pressure causes heart disease and increases the risk of stroke. This is more likely to develop in people who have high blood pressure readings or are overweight. Have your blood pressure checked: Every 3-5 years if you are 55-58 years of age. Every year if you are 44 years old or older. Diabetes Have regular diabetes screenings. This checks your fasting blood sugar level. Have the screening done: Once every three years after age 38 if you are at a normal weight and have a low risk for diabetes. More often and at a younger age if you are overweight or have a high risk for diabetes. What should I know about preventing infection? Hepatitis B If you have a higher risk for hepatitis B, you should be screened for this virus. Talk with your health care provider to find out if you are at risk for hepatitis B infection. Hepatitis C Testing is recommended for: Everyone born from [birth year] through 1966. Anyone with known risk factors for hepatitis C. Sexually transmitted infections (STIs) Get screened for STIs, including gonorrhea and chlamydia, if: You are sexually active and are younger than 34 years of age. You are older than 34 years of age and your health care provider tells you that you are at risk for this type of infection. Your sexual activity has changed since you were  last screened, and you are at increased risk for chlamydia or gonorrhea. Ask your health care provider if you are at risk. Ask your health care provider about whether you are at high risk for HIV. Your health care provider may recommend a prescription medicine to help prevent HIV infection. If you choose to take medicine to prevent HIV, you should first get tested  for HIV. You should then be tested every 3 months for as long as you are taking the medicine. Pregnancy If you are about to stop having your period (premenopausal) and you may become pregnant, seek counseling before you get pregnant. Take 400 to 800 micrograms (mcg) of folic acid every day if you become pregnant. Ask for birth control (contraception) if you want to prevent pregnancy. Osteoporosis and menopause Osteoporosis is a disease in which the bones lose minerals and strength with aging. This can result in bone fractures. If you are 56 years old or older, or if you are at risk for osteoporosis and fractures, ask your health care provider if you should: Be screened for bone loss. Take a calcium or vitamin D supplement to lower your risk of fractures. Be given hormone replacement therapy (HRT) to treat symptoms of menopause. Follow these instructions at home: Alcohol use Do not drink alcohol if: Your health care provider tells you not to drink. You are pregnant, may be pregnant, or are planning to become pregnant. If you drink alcohol: Limit how much you have to: 0-1 drink a day. Know how much alcohol is in your drink. In the [place] one drink equals one 12 oz bottle of beer (355 mL), one 5 oz glass of wine (148 mL), or one 1 oz glass of hard liquor (44 mL). Lifestyle Do not use any products that contain nicotine or tobacco. These products include cigarettes, chewing tobacco, and vaping devices, such as e-cigarettes. If you need help quitting, ask your health care provider. Do not use street drugs. Do not share needles. Ask your health care provider for help if you need support or information about quitting drugs. General instructions Schedule regular health, dental, and eye exams. Stay current with your vaccines. Tell your health care provider if: You often feel depressed. You have ever been abused or do not feel safe at home. Summary Adopting a healthy lifestyle and getting  preventive care are important in promoting health and wellness. Follow your health care provider's instructions about healthy diet, exercising, and getting tested or screened for diseases. Follow your health care provider's instructions on monitoring your cholesterol and blood pressure. This information is not intended to replace advice given to you by your health care provider. Make sure you discuss any questions you have with your health care provider. Document Revised: 10/16/2020 Document Reviewed: 10/16/2020 Elsevier Patient Education  2024 [employer]    Why follow it? Research shows. Those who follow the Mediterranean diet have a reduced risk of heart disease  The diet is associated with a reduced incidence of Parkinson's and Alzheimer's diseases People following the diet may have longer life expectancies and lower rates of chronic diseases  The Dietary Guidelines for Americans recommends the Mediterranean diet as an eating plan to promote health and prevent disease  What Is the Mediterranean Diet?  Healthy eating plan based on typical foods and recipes of Mediterranean-style cooking The diet is primarily a plant based diet; these foods should make up a majority of meals   Starches - Plant based foods should make up a majority of meals -  They are an important sources of vitamins, minerals, energy, antioxidants, and fiber - Choose whole grains, foods high in fiber and minimally processed items  - Typical grain sources include wheat, oats, barley, corn, brown rice, bulgar, farro, millet, polenta, couscous  - Various types of beans include chickpeas, lentils, fava beans, black beans, white beans   Fruits  Veggies - Large quantities of antioxidant rich fruits & veggies; 6 or more servings  - Vegetables can be eaten raw or lightly drizzled with oil and cooked  - Vegetables common to the traditional Mediterranean Diet include: artichokes, arugula, beets, broccoli, brussel sprouts, cabbage,  carrots, celery, collard greens, cucumbers, eggplant, kale, leeks, lemons, lettuce, mushrooms, okra, onions, peas, peppers, potatoes, pumpkin, radishes, rutabaga, shallots, spinach, sweet potatoes, turnips, zucchini - Fruits common to the Mediterranean Diet include: apples, apricots, avocados, cherries, clementines, dates, figs, grapefruits, grapes, melons, nectarines, oranges, peaches, pears, pomegranates, strawberries, tangerines  Fats - Replace butter and margarine with healthy oils, such as olive oil, canola oil, and tahini  - Limit nuts to no more than a handful a day  - Nuts include walnuts, almonds, pecans, pistachios, pine nuts  - Limit or avoid candied, honey roasted or heavily salted nuts - Olives are central to the [employer] - can be eaten whole or used in a variety of dishes   Meats Protein - Limiting red meat: no more than a few times a month - When eating red meat: choose lean cuts and keep the portion to the size of deck of cards - Eggs: approx. 0 to 4 times a week  - Fish and lean poultry: at least 2 a week  - Healthy protein sources include, chicken, [place], lean beef, lamb - Increase intake of seafood such as tuna, salmon, trout, mackerel, shrimp, scallops - Avoid or limit high fat processed meats such as sausage and bacon  Dairy - Include moderate amounts of low fat dairy products  - Focus on healthy dairy such as fat free yogurt, skim milk, low or reduced fat cheese - Limit dairy products higher in fat such as whole or 2% milk, cheese, ice cream  Alcohol - Moderate amounts of red wine is ok  - No more than 5 oz daily for women (all ages) and men older than age 65  - No more than 10 oz of wine daily for men younger than 81  Other - Limit sweets and other desserts  - Use herbs and spices instead of salt to flavor foods  - Herbs and spices common to the traditional Mediterranean Diet include: basil, bay leaves, chives, cloves, cumin, fennel, garlic, lavender, marjoram,  mint, oregano, parsley, pepper, rosemary, sage, savory, sumac, tarragon, thyme   It's not just a diet, it's a lifestyle:  The Mediterranean diet includes lifestyle factors typical of those in the region  Foods, drinks and meals are best eaten with others and savored Daily physical activity is important for overall good health This could be strenuous exercise like running and aerobics This could also be more leisurely activities such as walking, housework, yard-work, or taking the stairs Moderation is the key; a balanced and healthy diet accommodates most foods and drinks Consider portion sizes and frequency of consumption of certain foods   Meal Ideas & Options:  Breakfast:  Whole wheat toast or whole wheat English muffins with peanut butter & hard boiled egg Steel cut oats topped with apples & cinnamon and skim milk  Fresh fruit: banana, strawberries, melon, berries, peaches  Smoothies: strawberries,  bananas, greek yogurt, peanut butter Low fat greek yogurt with blueberries and granola  Egg white omelet with spinach and mushrooms Breakfast couscous: whole wheat couscous, apricots, skim milk, cranberries  Sandwiches:  Hummus and grilled vegetables (peppers, zucchini, squash) on whole wheat bread   Grilled chicken on whole wheat pita with lettuce, tomatoes, cucumbers or tzatziki  [place] salad on whole wheat bread: tuna salad made with greek yogurt, olives, red peppers, capers, green onions Garlic rosemary lamb pita: lamb sauted with garlic, rosemary, salt & pepper; add lettuce, cucumber, greek yogurt to pita - flavor with lemon juice and black pepper  Seafood:  Mediterranean grilled salmon, seasoned with garlic, basil, parsley, lemon juice and black pepper Shrimp, lemon, and spinach whole-grain pasta salad made with low fat greek yogurt  Seared scallops with lemon orzo  Seared tuna steaks seasoned salt, pepper, coriander topped with tomato mixture of olives, tomatoes, olive oil, minced  garlic, parsley, green onions and cappers  Meats:  Herbed greek chicken salad with kalamata olives, cucumber, feta  Red bell peppers stuffed with spinach, bulgur, lean ground beef (or lentils) & topped with feta   Kebabs: skewers of chicken, tomatoes, onions, zucchini, squash  [place] burgers: made with red onions, mint, dill, lemon juice, feta cheese topped with roasted red peppers Vegetarian Cucumber salad: cucumbers, artichoke hearts, celery, red onion, feta cheese, tossed in olive oil & lemon juice  Hummus and whole grain pita points with a greek salad (lettuce, tomato, feta, olives, cucumbers, red onion) Lentil soup with celery, carrots made with vegetable broth, garlic, salt and pepper  Tabouli salad: parsley, bulgur, mint, scallions, cucumbers, tomato, radishes, lemon juice, olive oil, salt and pepper.      American Heart Association (AHA) Exercise Recommendation  Being physically active is important to prevent heart disease and stroke, the nation's No. 1and No. 5killers. To improve overall cardiovascular health, we suggest at least 150 minutes per week of moderate exercise or 75 minutes per week of vigorous exercise (or a combination of moderate and vigorous activity). Thirty minutes a day, five times a week is an easy goal to remember. You will also experience benefits even if you divide your time into two or three segments of 10 to 15 minutes per day.  For people who would benefit from lowering their blood pressure or cholesterol, we recommend 40 minutes of aerobic exercise of moderate to vigorous intensity three to four times a week to lower the risk for heart attack and stroke.  Physical activity is anything that makes you move your body and burn calories.  This includes things like climbing stairs or playing sports. Aerobic exercises benefit your heart, and include walking, jogging, swimming or biking. Strength and stretching exercises are best for overall stamina and flexibility.   The simplest, positive change you can make to effectively improve your heart health is to start walking. It's enjoyable, free, easy, social and great exercise. A walking program is flexible and boasts high success rates because people can stick with it. It's easy for walking to become a regular and satisfying part of life.   For Overall Cardiovascular Health: At least 30 minutes of moderate-intensity aerobic activity at least 5 days per week for a total of 150  OR  At least 25 minutes of vigorous aerobic activity at least 3 days per week for a total of 75 minutes; or a combination of moderate- and vigorous-intensity aerobic activity  AND  Moderate- to high-intensity muscle-strengthening activity at least 2 days per week for  additional health benefits.  For Lowering Blood Pressure and Cholesterol An average 40 minutes of moderate- to vigorous-intensity aerobic activity 3 or 4 times per week  What if I can't make it to the time goal? Something is always better than nothing! And everyone has to start somewhere. Even if you've been sedentary for years, today is the day you can begin to make healthy changes in your life. If you don't think you'll make it for 30 or 40 minutes, set a reachable goal for today. You can work up toward your overall goal by increasing your time as you get stronger. Don't let all-or-nothing thinking rob you of doing what you can every day.  Source:http://www.heart.org

## 2023-11-29 LAB — LIPID PANEL
Chol/HDL Ratio: 3.5 ratio (ref 0.0–4.4)
Cholesterol, Total: 170 mg/dL (ref 100–199)
HDL: 49 mg/dL (ref >39)
LDL Chol Calc (NIH): 104 mg/dL — ABNORMAL HIGH (ref 0–99)
Triglycerides: 90 mg/dL (ref 0–149)
VLDL Cholesterol Cal: 17 mg/dL (ref 5–40)

## 2023-11-29 LAB — COMPREHENSIVE METABOLIC PANEL WITH GFR
ALT: 36 IU/L — ABNORMAL HIGH (ref 0–32)
AST: 25 IU/L (ref 0–40)
Albumin: 4.4 g/dL (ref 3.9–4.9)
Alkaline Phosphatase: 59 IU/L (ref 44–121)
BUN/Creatinine Ratio: 17 (ref 9–23)
BUN: 15 mg/dL (ref 6–20)
Bilirubin Total: 0.3 mg/dL (ref 0.0–1.2)
CO2: 23 mmol/L (ref 20–29)
Calcium: 9.3 mg/dL (ref 8.7–10.2)
Chloride: 103 mmol/L (ref 96–106)
Creatinine, Ser: 0.87 mg/dL (ref 0.57–1.00)
Globulin, Total: 2.7 g/dL (ref 1.5–4.5)
Glucose: 87 mg/dL (ref 70–99)
Potassium: 4.8 mmol/L (ref 3.5–5.2)
Sodium: 138 mmol/L (ref 134–144)
Total Protein: 7.1 g/dL (ref 6.0–8.5)
eGFR: 90 mL/min/{1.73_m2} (ref >59)

## 2023-11-29 LAB — CBC WITH DIFFERENTIAL/PLATELET
Basophils Absolute: 0.1 10*3/uL (ref 0.0–0.2)
Basos: 1 %
EOS (ABSOLUTE): 0.3 10*3/uL (ref 0.0–0.4)
Eos: 4 %
Hematocrit: 44.2 % (ref 34.0–46.6)
Hemoglobin: 14.3 g/dL (ref 11.1–15.9)
Immature Grans (Abs): 0 10*3/uL (ref 0.0–0.1)
Immature Granulocytes: 0 %
Lymphocytes Absolute: 2.6 10*3/uL (ref 0.7–3.1)
Lymphs: 27 %
MCH: 30.4 pg (ref 26.6–33.0)
MCHC: 32.4 g/dL (ref 31.5–35.7)
MCV: 94 fL (ref 79–97)
Monocytes Absolute: 0.7 10*3/uL (ref 0.1–0.9)
Monocytes: 7 %
Neutrophils Absolute: 5.9 10*3/uL (ref 1.4–7.0)
Neutrophils: 61 %
Platelets: 266 10*3/uL (ref 150–450)
RBC: 4.71 x10E6/uL (ref 3.77–5.28)
RDW: 11.9 % (ref 11.7–15.4)
WBC: 9.5 10*3/uL (ref 3.4–10.8)

## 2023-11-29 LAB — HEMOGLOBIN A1C
Est. average glucose Bld gHb Est-mCnc: 105 mg/dL
Hgb A1c MFr Bld: 5.3 % (ref 4.8–5.6)

## 2023-11-29 LAB — TSH: TSH: 1.8 u[IU]/mL (ref 0.450–4.500)

## 2023-12-01 ENCOUNTER — Ambulatory Visit (HOSPITAL_BASED_OUTPATIENT_CLINIC_OR_DEPARTMENT_OTHER): Payer: Self-pay | Admitting: Student

## 2023-12-04 ENCOUNTER — Encounter (HOSPITAL_BASED_OUTPATIENT_CLINIC_OR_DEPARTMENT_OTHER): Payer: Self-pay

## 2023-12-25 ENCOUNTER — Encounter (HOSPITAL_BASED_OUTPATIENT_CLINIC_OR_DEPARTMENT_OTHER): Payer: Self-pay | Admitting: Pharmacy Technician

## 2023-12-25 ENCOUNTER — Other Ambulatory Visit (HOSPITAL_BASED_OUTPATIENT_CLINIC_OR_DEPARTMENT_OTHER): Payer: Self-pay

## 2023-12-25 DIAGNOSIS — Z8249 Family history of ischemic heart disease and other diseases of the circulatory system: Secondary | ICD-10-CM | POA: Diagnosis not present

## 2023-12-25 DIAGNOSIS — N911 Secondary amenorrhea: Secondary | ICD-10-CM | POA: Diagnosis not present

## 2023-12-25 DIAGNOSIS — F419 Anxiety disorder, unspecified: Secondary | ICD-10-CM | POA: Diagnosis not present

## 2023-12-25 DIAGNOSIS — R110 Nausea: Secondary | ICD-10-CM | POA: Diagnosis not present

## 2023-12-25 MED ORDER — DOXYLAMINE-PYRIDOXINE 10-10 MG PO TBEC: 2.0000 | DELAYED_RELEASE_TABLET | Freq: Every day | ORAL | 0 refills | Status: DC

## 2023-12-26 ENCOUNTER — Other Ambulatory Visit (HOSPITAL_BASED_OUTPATIENT_CLINIC_OR_DEPARTMENT_OTHER): Payer: Self-pay

## 2023-12-29 ENCOUNTER — Other Ambulatory Visit (HOSPITAL_BASED_OUTPATIENT_CLINIC_OR_DEPARTMENT_OTHER): Payer: Self-pay

## 2024-01-01 DIAGNOSIS — Z3685 Encounter for antenatal screening for Streptococcus B: Secondary | ICD-10-CM | POA: Diagnosis not present

## 2024-01-01 DIAGNOSIS — Z3481 Encounter for supervision of other normal pregnancy, first trimester: Secondary | ICD-10-CM | POA: Diagnosis not present

## 2024-01-01 DIAGNOSIS — Z3A08 8 weeks gestation of pregnancy: Secondary | ICD-10-CM | POA: Diagnosis not present

## 2024-01-01 DIAGNOSIS — Z3400 Encounter for supervision of normal first pregnancy, unspecified trimester: Secondary | ICD-10-CM | POA: Diagnosis not present

## 2024-01-01 LAB — OB RESULTS CONSOLE RPR: RPR: NONREACTIVE

## 2024-01-01 LAB — OB RESULTS CONSOLE HEPATITIS B SURFACE ANTIGEN: Hepatitis B Surface Ag: NEGATIVE

## 2024-01-01 LAB — HEPATITIS C ANTIBODY: HCV Ab: NEGATIVE

## 2024-01-01 LAB — OB RESULTS CONSOLE HIV ANTIBODY (ROUTINE TESTING): HIV: NONREACTIVE

## 2024-01-01 LAB — OB RESULTS CONSOLE RUBELLA ANTIBODY, IGM: Rubella: IMMUNE

## 2024-01-02 ENCOUNTER — Other Ambulatory Visit (HOSPITAL_BASED_OUTPATIENT_CLINIC_OR_DEPARTMENT_OTHER): Payer: Self-pay

## 2024-01-15 DIAGNOSIS — Z331 Pregnant state, incidental: Secondary | ICD-10-CM | POA: Diagnosis not present

## 2024-01-15 DIAGNOSIS — Z3481 Encounter for supervision of other normal pregnancy, first trimester: Secondary | ICD-10-CM | POA: Diagnosis not present

## 2024-01-15 DIAGNOSIS — Z113 Encounter for screening for infections with a predominantly sexual mode of transmission: Secondary | ICD-10-CM | POA: Diagnosis not present

## 2024-01-15 DIAGNOSIS — Z3A10 10 weeks gestation of pregnancy: Secondary | ICD-10-CM | POA: Diagnosis not present

## 2024-01-15 LAB — OB RESULTS CONSOLE GC/CHLAMYDIA
Chlamydia: NEGATIVE
Neisseria Gonorrhea: NEGATIVE

## 2024-02-17 DIAGNOSIS — Z361 Encounter for antenatal screening for raised alphafetoprotein level: Secondary | ICD-10-CM | POA: Diagnosis not present

## 2024-02-22 ENCOUNTER — Encounter (HOSPITAL_COMMUNITY): Payer: Self-pay | Admitting: Obstetrics and Gynecology

## 2024-02-22 ENCOUNTER — Inpatient Hospital Stay (HOSPITAL_COMMUNITY)
Admission: AD | Admit: 2024-02-22 | Discharge: 2024-02-22 | Disposition: A | Discharge status: Home / Self Care | Attending: Obstetrics and Gynecology | Admitting: Obstetrics and Gynecology

## 2024-02-22 ENCOUNTER — Inpatient Hospital Stay (HOSPITAL_COMMUNITY)

## 2024-02-22 ENCOUNTER — Other Ambulatory Visit: Payer: Self-pay

## 2024-02-22 DIAGNOSIS — R109 Unspecified abdominal pain: Secondary | ICD-10-CM | POA: Insufficient documentation

## 2024-02-22 DIAGNOSIS — Z3A15 15 weeks gestation of pregnancy: Secondary | ICD-10-CM

## 2024-02-22 DIAGNOSIS — O26852 Spotting complicating pregnancy, second trimester: Secondary | ICD-10-CM | POA: Diagnosis not present

## 2024-02-22 DIAGNOSIS — O23592 Infection of other part of genital tract in pregnancy, second trimester: Secondary | ICD-10-CM | POA: Insufficient documentation

## 2024-02-22 DIAGNOSIS — O26892 Other specified pregnancy related conditions, second trimester: Secondary | ICD-10-CM | POA: Insufficient documentation

## 2024-02-22 DIAGNOSIS — B9689 Other specified bacterial agents as the cause of diseases classified elsewhere: Secondary | ICD-10-CM

## 2024-02-22 DIAGNOSIS — O23599 Infection of other part of genital tract in pregnancy, unspecified trimester: Secondary | ICD-10-CM

## 2024-02-22 DIAGNOSIS — N939 Abnormal uterine and vaginal bleeding, unspecified: Secondary | ICD-10-CM

## 2024-02-22 LAB — URINALYSIS, ROUTINE W REFLEX MICROSCOPIC
Bacteria, UA: NONE SEEN
Bilirubin Urine: NEGATIVE
Glucose, UA: NEGATIVE mg/dL
Ketones, ur: NEGATIVE mg/dL
Nitrite: NEGATIVE
Protein, ur: NEGATIVE mg/dL
Specific Gravity, Urine: 1.01 (ref 1.005–1.030)
pH: 7 (ref 5.0–8.0)

## 2024-02-22 LAB — ABO/RH: ABO/RH(D): A POS

## 2024-02-22 LAB — WET PREP, GENITAL
Sperm: NONE SEEN
Trich, Wet Prep: NONE SEEN
WBC, Wet Prep HPF POC: >=10 — AB (ref <10)
Yeast Wet Prep HPF POC: NONE SEEN

## 2024-02-22 LAB — CBC
HCT: 39.3 % (ref 36.0–46.0)
Hemoglobin: 13.1 g/dL (ref 12.0–15.0)
MCH: 29.8 pg (ref 26.0–34.0)
MCHC: 33.3 g/dL (ref 30.0–36.0)
MCV: 89.3 fL (ref 80.0–100.0)
Platelets: 257 K/uL (ref 150–400)
RBC: 4.4 MIL/uL (ref 3.87–5.11)
RDW: 11.7 % (ref 11.5–15.5)
WBC: 11.2 K/uL — ABNORMAL HIGH (ref 4.0–10.5)
nRBC: 0 % (ref 0.0–0.2)

## 2024-02-22 MED ORDER — METRONIDAZOLE 500 MG PO TABS
500.0000 mg | ORAL_TABLET | Freq: Two times a day (BID) | ORAL | 0 refills | Status: AC
  Filled 2024-02-22: qty 14, 7d supply, fill #0

## 2024-02-22 NOTE — Discharge Instructions (Signed)
 You have an overgrowth of bacteria in your vagina called bacterial vaginosis. This is not a sexually transmitted infection.  Sexual partners do not need to be treated, however abstaining from sex or using condoms may prevent recurrence of the overgrowth. Take the full course of the antibiotic prescribed to you even if you begin to feel better.  I would also recommend pelvic rest (nothing in the vagina) for the next week or so.  Reasons to return to [facility] at [facility]: If you have heavier bleeding that soaks through more that 2 pads per hour for an hour or more If you bleed so much that you feel like you might pass out or you do pass out If you have significant abdominal pain that is not improved with Tylenol  1000 mg every 8 hours as needed for pain If you develop a fever > 100.5

## 2024-02-22 NOTE — MAU Note (Signed)
.  Chelsey Roberts is a 33 y.o. at Unknown here in [facility]U reporting: Patient reports vaginal bleeding that started this morning at 0900. Patient reports abdominal sharp pain that started yesterday.   Onset of complaint: yesterday  Pain score: 2/10 There were no vitals filed for this visit.   [id number] obtain in room  Lab orders placed from triage:   ua

## 2024-02-22 NOTE — MAU Provider Note (Signed)
 Chief Complaint:  Vaginal Bleeding and Abdominal Pain   HPI   None     Chelsey Roberts is a 33 y.o. G2P1001 at [redacted]w[redacted]d presenting to maternity admissions reporting vaginal bleeding. She endorses light bleeding that started today at 0900. She endorses sharp abdominal pain that started yesterday. The pain is mostly on the right side today and occurs occasionally, it does not feel like cramps. No history of C-section, history of abruption, history of ruptured membranes, and history of preterm delivery. She Denies trauma and recent intercourse. Patient does not have history of risk factors: drug use, smoking, hypertension, and infections.  Pregnancy Course: Receives care at [facility]. Prenatal records reviewed.   Past Medical History:  Diagnosis Date   Allergy     Tamaflu, seasonal   Anxiety    Headache    History of acute cystitis    Recurrent upper respiratory infection (URI)    OB History  Gravida Para Term Preterm AB Living  2 1 1   1   SAB IAB Ectopic Multiple Live Births     0 1    # Outcome Date GA Lbr Len/2nd Weight Sex Type Anes PTL Lv  2 Current           1 Term 09/04/17 [redacted]w[redacted]d 13:12 / 01:42 3260 g F Vag-Vacuum EPI  LIV   History reviewed. No pertinent surgical history. Family History  Problem Relation Age of Onset   Allergic rhinitis Mother    Heart murmur Mother    Cancer Mother    Hypertension Father    Cancer Maternal Grandmother    Esophageal cancer Maternal Grandfather    Cancer Maternal Grandfather    Hypertension Paternal Grandmother    Stroke Paternal Grandmother    Hypertension Paternal Grandfather    Diabetes Paternal Grandfather    Heart attack Paternal Grandfather    Angioedema Neg Hx    Asthma Neg Hx    Atopy Neg Hx    Eczema Neg Hx    Immunodeficiency Neg Hx    Urticaria Neg Hx    Social History   Tobacco Use   Smoking status: Never    Passive exposure: Never   Smokeless tobacco: Never  Vaping Use   Vaping status: Never Used   Substance Use Topics   Alcohol use: Yes    Comment: occasionally   Drug use: No   Allergies  Allergen Reactions   Tamiflu [Oseltamivir Phosphate] Nausea And Vomiting   Oseltamivir Hives and Nausea Only   Medications Prior to Admission  Medication Sig Dispense Refill Last Dose/Taking   acetaminophen  (TYLENOL ) 500 MG tablet Take 1,000 mg by mouth every 6 (six) hours as needed for headache.      azelastine  (ASTELIN ) 0.1 % nasal spray Place 1 spray into both nostrils 2 (two) times daily. Use in each nostril as directed 30 mL 5    hydrOXYzine  (ATARAX ) 10 MG tablet Take 1 tablet (10 mg total) by mouth 1-3 times daily as needed for panic attacks 30 tablet 0    levocetirizine (XYZAL ) 5 MG tablet Take 5 mg by mouth as needed for allergies.      rizatriptan  (MAXALT ) 10 MG tablet Take 1 tablet (10 mg total) by mouth as directed at onset of migraine, may take a second dose after 2 hours if needed. 9 tablet 5     I have reviewed patient's Past Medical Hx, Surgical Hx, Family Hx, Social Hx, medications and allergies.   ROS  Pertinent items noted in  HPI and remainder of comprehensive ROS otherwise negative.   PHYSICAL EXAM  Patient Vitals for the past 24 hrs:  BP Temp Pulse Resp SpO2 Weight  02/22/24 1103 130/88 99.4 F (37.4 C) 88 12 100 % --  02/22/24 1100 -- -- -- -- -- 87.2 kg    Constitutional: Well-developed, well-nourished female in no acute distress.  HEENT: atraumatic, normocephalic. Neck has normal ROM. EOM intact. Cardiovascular: normal rate & rhythm, warm and well-perfused Respiratory: normal effort, no problems with respiration noted GI: Abd soft, non-tender, non-distended MSK: Extremities nontender, no edema, normal ROM Skin: warm and dry. Acyanotic, no jaundice or pallor. Neurologic: Alert and oriented x 4. No abnormal coordination. Psychiatric: Normal mood. Speech not slurred, not rapid/pressured. Patient is cooperative. GU: no CVA tenderness Pelvic exam: VULVA: normal  appearing vulva with no masses, tenderness or lesions, VAGINA: normal appearing vagina with normal color and discharge, no lesions, CERVIX: normal appearing cervix without discharge or lesions, multiparous os, scant brown mucus/blood in vaginal canal, exam chaperoned by Erline NT.     Labs: Results for orders placed or performed during the hospital encounter of 02/22/24 (from the past 24 hours)  Urinalysis, Routine w reflex microscopic -Urine, Clean Catch     Status: Abnormal   Collection Time: 02/22/24 11:10 AM  Result Value Ref Range   Color, Urine YELLOW YELLOW   APPearance CLEAR CLEAR   Specific Gravity, Urine 1.010 1.005 - 1.030   pH 7.0 5.0 - 8.0   Glucose, UA NEGATIVE NEGATIVE mg/dL   Hgb urine dipstick MODERATE (A) NEGATIVE   Bilirubin Urine NEGATIVE NEGATIVE   Ketones, ur NEGATIVE NEGATIVE mg/dL   Protein, ur NEGATIVE NEGATIVE mg/dL   Nitrite NEGATIVE NEGATIVE   Leukocytes,Ua SMALL (A) NEGATIVE   RBC / HPF 0-5 0 - 5 RBC/hpf   WBC, UA 0-5 0 - 5 WBC/hpf   Bacteria, UA NONE SEEN NONE SEEN   Squamous Epithelial / HPF 0-5 0 - 5 /HPF   Mucus PRESENT   ABO/Rh     Status: None   Collection Time: 02/22/24 12:04 PM  Result Value Ref Range   ABO/RH(D) A POS    No rh immune globuloin      NOT A RH IMMUNE GLOBULIN CANDIDATE, PT RH POSITIVE Performed at [facility], [address], [place], [place] [address]   CBC     Status: Abnormal   Collection Time: 02/22/24 12:06 PM  Result Value Ref Range   WBC 11.2 (H) 4.0 - 10.5 K/uL   RBC 4.40 3.87 - 5.11 MIL/uL   Hemoglobin 13.1 12.0 - 15.0 g/dL   HCT 60.6 63.9 - 53.9 %   MCV 89.3 80.0 - 100.0 fL   MCH 29.8 26.0 - 34.0 pg   MCHC 33.3 30.0 - 36.0 g/dL   RDW 88.2 88.4 - 84.4 %   Platelets 257 150 - 400 K/uL   nRBC 0.0 0.0 - 0.2 %  Wet prep, genital     Status: Abnormal   Collection Time: 02/22/24 12:55 PM  Result Value Ref Range   Yeast Wet Prep HPF POC NONE SEEN NONE SEEN   Trich, Wet Prep NONE SEEN NONE SEEN   Clue Cells  Wet Prep HPF POC PRESENT (A) NONE SEEN   WBC, Wet Prep HPF POC >=10 (A) <10   Sperm NONE SEEN     Imaging:  No results found.  MDM & MAU COURSE  MDM: High  MAU Course: -Vital signs within normal limits. -CBC within  normal limits. -No UTI on UA. -No active bleeding on speculum exam, scant blood in vaginal canal. -Wet prep positive for BV, treat with metronidazole . -US  shows no placenta previa or placental abruption. Scant vaginal bleeding also makes placental abruption less likely. FHT appropriate on US .   Differential diagnosis considered for 2nd or 3rd trimester vaginal bleeding includes but is not limited to: preterm labor, placenta previa, vasa previa, placental abruption, infection, marginal separation, cervical or vaginal lesion   Orders Placed This Encounter  Procedures   Wet prep, genital   US  MFM OB COMP + 14 WK   Urinalysis, Routine w reflex microscopic -Urine, Clean Catch   CBC   ABO/Rh   Discharge patient   Meds ordered this encounter  Medications   metroNIDAZOLE  (FLAGYL ) 500 MG tablet    Sig: Take 1 tablet (500 mg total) by mouth 2 (two) times daily for 7 days.    Dispense:  14 tablet    Refill:  0    ASSESSMENT   1. Bacterial vaginosis in pregnancy   2. Vaginal spotting   3. [redacted] weeks gestation of pregnancy     PLAN  Discharge home in stable condition with return precautions.  Metronidazole  for BV. Recommend pelvic rest until US  results come back tomorrow. If subchorionic hematoma confirmed, extend pelvic rest.   Follow-up Information     [place], [facility]. Go to.   Why: As scheduled for ongoing prenatal care Contact information: [address] [place] [address] [phone]                  Allergies as of 02/22/2024       Reactions   Tamiflu [oseltamivir Phosphate] Nausea And Vomiting   Oseltamivir Hives, Nausea Only        Medication List     TAKE these medications    acetaminophen  500 MG  tablet Commonly known as: TYLENOL  Take 1,000 mg by mouth every 6 (six) hours as needed for headache.   azelastine  0.1 % nasal spray Commonly known as: ASTELIN  Place 1 spray into both nostrils 2 (two) times daily. Use in each nostril as directed   hydrOXYzine  10 MG tablet Commonly known as: ATARAX  Take 1 tablet (10 mg total) by mouth 1-3 times daily as needed for panic attacks   levocetirizine 5 MG tablet Commonly known as: XYZAL  Take 5 mg by mouth as needed for allergies.   metroNIDAZOLE  500 MG tablet Commonly known as: FLAGYL  Take 1 tablet (500 mg total) by mouth 2 (two) times daily for 7 days.   rizatriptan  10 MG tablet Commonly known as: MAXALT  Take 1 tablet (10 mg total) by mouth as directed at onset of migraine, may take a second dose after 2 hours if needed.        Joesph DELENA Sear, PA

## 2024-02-23 ENCOUNTER — Other Ambulatory Visit (HOSPITAL_BASED_OUTPATIENT_CLINIC_OR_DEPARTMENT_OTHER): Payer: Self-pay

## 2024-03-09 DIAGNOSIS — H5213 Myopia, bilateral: Secondary | ICD-10-CM | POA: Diagnosis not present

## 2024-03-19 DIAGNOSIS — Z3482 Encounter for supervision of other normal pregnancy, second trimester: Secondary | ICD-10-CM | POA: Diagnosis not present

## 2024-03-19 DIAGNOSIS — Z3A19 19 weeks gestation of pregnancy: Secondary | ICD-10-CM | POA: Diagnosis not present

## 2024-03-19 DIAGNOSIS — Z363 Encounter for antenatal screening for malformations: Secondary | ICD-10-CM | POA: Diagnosis not present

## 2024-04-15 DIAGNOSIS — Z3A23 23 weeks gestation of pregnancy: Secondary | ICD-10-CM | POA: Diagnosis not present

## 2024-04-15 DIAGNOSIS — Z362 Encounter for other antenatal screening follow-up: Secondary | ICD-10-CM | POA: Diagnosis not present

## 2024-04-15 DIAGNOSIS — Z3482 Encounter for supervision of other normal pregnancy, second trimester: Secondary | ICD-10-CM | POA: Diagnosis not present

## 2024-05-11 DIAGNOSIS — Z3A27 27 weeks gestation of pregnancy: Secondary | ICD-10-CM | POA: Diagnosis not present

## 2024-05-11 DIAGNOSIS — Z3400 Encounter for supervision of normal first pregnancy, unspecified trimester: Secondary | ICD-10-CM | POA: Diagnosis not present

## 2024-05-11 DIAGNOSIS — Z3480 Encounter for supervision of other normal pregnancy, unspecified trimester: Secondary | ICD-10-CM | POA: Diagnosis not present

## 2024-05-11 DIAGNOSIS — Z23 Encounter for immunization: Secondary | ICD-10-CM | POA: Diagnosis not present

## 2024-05-13 DIAGNOSIS — R7309 Other abnormal glucose: Secondary | ICD-10-CM | POA: Diagnosis not present

## 2024-07-08 ENCOUNTER — Telehealth (HOSPITAL_COMMUNITY): Payer: Self-pay | Admitting: *Deleted

## 2024-07-08 NOTE — Telephone Encounter (Signed)
 Preadmission screen

## 2024-07-22 ENCOUNTER — Inpatient Hospital Stay (HOSPITAL_COMMUNITY)

## 2024-07-22 ENCOUNTER — Inpatient Hospital Stay (HOSPITAL_COMMUNITY): Admission: RE | Admit: 2024-07-22 | Source: Home / Self Care | Admitting: Obstetrics and Gynecology

## 2024-12-03 ENCOUNTER — Encounter (HOSPITAL_BASED_OUTPATIENT_CLINIC_OR_DEPARTMENT_OTHER): Admitting: Student
